# Patient Record
Sex: Female | Born: 2000 | Race: Black or African American | Hispanic: No | Marital: Single | Smoking: Never smoker
Health system: Southern US, Community
[De-identification: ages and names within clinical notes are randomized; demographics above are authoritative.]

## PROBLEM LIST (undated history)

## (undated) DIAGNOSIS — F329 Major depressive disorder, single episode, unspecified: Secondary | ICD-10-CM

## (undated) DIAGNOSIS — F32A Depression, unspecified: Secondary | ICD-10-CM

## (undated) DIAGNOSIS — R011 Cardiac murmur, unspecified: Secondary | ICD-10-CM

## (undated) DIAGNOSIS — D219 Benign neoplasm of connective and other soft tissue, unspecified: Secondary | ICD-10-CM

## (undated) HISTORY — DX: Benign neoplasm of connective and other soft tissue, unspecified: D21.9

## (undated) HISTORY — DX: Cardiac murmur, unspecified: R01.1

## (undated) HISTORY — PX: NO PAST SURGERIES: SHX2092

---
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
## 2020-01-06 ENCOUNTER — Telehealth: Payer: Self-pay | Admitting: Family Medicine

## 2020-01-06 NOTE — Telephone Encounter (Signed)
Attempted to contact patient to get her scheduled for her new ob intake and provider visit. Unable to reach patient due to the number stating that the call could not be completed at this time.

## 2020-03-08 ENCOUNTER — Other Ambulatory Visit: Payer: Self-pay

## 2020-03-08 ENCOUNTER — Encounter (HOSPITAL_COMMUNITY): Payer: Self-pay | Admitting: Obstetrics and Gynecology

## 2020-03-08 ENCOUNTER — Inpatient Hospital Stay (HOSPITAL_COMMUNITY)
Admission: AD | Admit: 2020-03-08 | Discharge: 2020-03-08 | Disposition: A | Discharge status: Home / Self Care | Payer: Medicaid Other | Attending: Obstetrics and Gynecology | Admitting: Obstetrics and Gynecology

## 2020-03-08 ENCOUNTER — Telehealth: Payer: Self-pay | Admitting: General Practice

## 2020-03-08 DIAGNOSIS — D259 Leiomyoma of uterus, unspecified: Secondary | ICD-10-CM | POA: Insufficient documentation

## 2020-03-08 DIAGNOSIS — O3412 Maternal care for benign tumor of corpus uteri, second trimester: Secondary | ICD-10-CM | POA: Insufficient documentation

## 2020-03-08 DIAGNOSIS — R102 Pelvic and perineal pain: Secondary | ICD-10-CM | POA: Diagnosis not present

## 2020-03-08 DIAGNOSIS — O26892 Other specified pregnancy related conditions, second trimester: Secondary | ICD-10-CM | POA: Diagnosis not present

## 2020-03-08 DIAGNOSIS — Z3A16 16 weeks gestation of pregnancy: Secondary | ICD-10-CM | POA: Insufficient documentation

## 2020-03-08 LAB — URINALYSIS, ROUTINE W REFLEX MICROSCOPIC
Bilirubin Urine: NEGATIVE
Glucose, UA: NEGATIVE mg/dL
Hgb urine dipstick: NEGATIVE
Ketones, ur: NEGATIVE mg/dL
Leukocytes,Ua: NEGATIVE
Nitrite: NEGATIVE
Protein, ur: NEGATIVE mg/dL
Specific Gravity, Urine: 1.025 (ref 1.005–1.030)
pH: 6 (ref 5.0–8.0)

## 2020-03-08 MED ORDER — CYCLOBENZAPRINE HCL 10 MG PO TABS
10.0000 mg | ORAL_TABLET | Freq: Three times a day (TID) | ORAL | 1 refills | Status: DC | PRN
Start: 2020-03-08 — End: 2020-07-14

## 2020-03-08 NOTE — MAU Provider Note (Signed)
First Provider Initiated Contact with Patient 03/08/20 1332      S [name] is a 19 y.o. G35P0 non-pregnant female who presents to MAU today with complaint of pelvic pain. She has been diagnosed with a fibroid during her [facility] in [place] and this is the same pain. She denies pain at this time, but is worried that the pain she's been having has affected the baby. Also wants to know the gender because she has a gender reveal on Sunday.   O BP 111/72 (BP Location: Right Arm)   Pulse 68   Temp 98.2 F (36.8 C) (Oral)   Resp 12   Wt 154 lb 1 oz (69.9 kg)   LMP 11/17/2019   SpO2 100%   BMI 23.77 kg/m  Physical Exam Vitals and nursing note reviewed.  Constitutional:      Appearance: She is well-developed and normal weight.  Cardiovascular:     Rate and Rhythm: Normal rate and regular rhythm.     Heart sounds: Normal heart sounds.  Pulmonary:     Effort: Pulmonary effort is normal.     Breath sounds: Normal breath sounds.  Abdominal:     General: Abdomen is flat. Bowel sounds are normal.     Palpations: Abdomen is soft.     Tenderness: There is no abdominal tenderness.     Comments: Appropriate gravid for dates  Genitourinary:    Comments: Cervical exam deferred, no VB/cramping  Skin:    General: Skin is warm and dry.     Capillary Refill: Capillary refill takes less than 2 seconds.  Neurological:     Mental Status: She is alert and oriented to person, place, and time.  Psychiatric:        Mood and Affect: Mood normal.        Behavior: Behavior normal.     A [redacted]w[redacted]d pregnant female Medical screening exam complete Hx of pelvic pain r/t fibroid - sent flexeril prescription to pharmacy, pt prefers to take at night (not now in MAU)  P Discharge from MAU in stable condition Patient to stay in [place] and establish prenatal care, desires to begin at [facility] for gender reveal prenatal imaging given Preterm labor precautions given Patient may  return to MAU as needed for pregnancy related complaints  Beverly Mckenzie, CNM 03/08/2020 1:52 PM

## 2020-03-08 NOTE — MAU Note (Signed)
Pt states that she got checked out in [place] about a month ago for abdominal pain. Pt reports that she was told she had a fibroid.   Pt states that she is reporting similar pain to that time, but more intense.   Denies vaginal bleeding or LOF.

## 2020-03-08 NOTE — Telephone Encounter (Signed)
Called and informed patient that she would need to come by the office to sign ROI to have records for current pregnancy faxed.  Pt verbalized understanding.

## 2020-03-08 NOTE — Telephone Encounter (Signed)
-----   Message from Gabriel Carina, [place] sent at 03/08/2020  2:04 PM EDT ----- Regarding: New OB Needs Appt Hello! Could you please contact this patient to schedule a NOB appt and anatomy scan? She was seen in MAU today and needs to establish care, prefers to do so at [place]. She was being seen for [facility] in [place], but is relocating permanently back to [place]. Thank you!!

## 2020-03-08 NOTE — Discharge Instructions (Signed)
[facility] [address] [place], [place] [address] [phone]  Second Trimester of Pregnancy  The second trimester is from week 14 through week 27 (month 4 through 6). This is often the time in pregnancy that you feel your best. Often times, morning sickness has lessened or quit. You may have more energy, and you may get hungry more often. Your unborn baby is growing rapidly. At the end of the sixth month, he or she is about 9 inches long and weighs about 1 pounds. You will likely feel the baby move between 18 and 20 weeks of pregnancy. Follow these instructions at home: Medicines  Take over-the-counter and prescription medicines only as told by your doctor. Some medicines are safe and some medicines are not safe during pregnancy.  Take a prenatal vitamin that contains at least 600 micrograms (mcg) of folic acid.  If you have trouble pooping (constipation), take medicine that will make your stool soft (stool softener) if your doctor approves. Eating and drinking   Eat regular, healthy meals.  Avoid raw meat and uncooked cheese.  If you get low calcium from the food you eat, talk to your doctor about taking a daily calcium supplement.  Avoid foods that are high in fat and sugars, such as fried and sweet foods.  If you feel sick to your stomach (nauseous) or throw up (vomit): ? Eat 4 or 5 small meals a day instead of 3 large meals. ? Try eating a few soda crackers. ? Drink liquids between meals instead of during meals.  To prevent constipation: ? Eat foods that are high in fiber, like fresh fruits and vegetables, whole grains, and beans. ? Drink enough fluids to keep your pee (urine) clear or pale yellow. Activity  Exercise only as told by your doctor. Stop exercising if you start to have cramps.  Do not exercise if it is too hot, too humid, or if you are in a place of great height (high altitude).  Avoid heavy lifting.  Wear low-heeled shoes. Sit and  stand up straight.  You can continue to have sex unless your doctor tells you not to. Relieving pain and discomfort  Wear a good support bra if your breasts are tender.  Take warm water baths (sitz baths) to soothe pain or discomfort caused by hemorrhoids. Use hemorrhoid cream if your doctor approves.  Rest with your legs raised if you have leg cramps or low back pain.  If you develop puffy, bulging veins (varicose veins) in your legs: ? Wear support hose or compression stockings as told by your doctor. ? Raise (elevate) your feet for 15 minutes, 3-4 times a day. ? Limit salt in your food. Prenatal care  Write down your questions. Take them to your prenatal visits.  Keep all your prenatal visits as told by your doctor. This is important. Safety  Wear your seat belt when driving.  Make a list of emergency phone numbers, including numbers for family, friends, the hospital, and police and fire departments. General instructions  Ask your doctor about the right foods to eat or for help finding a counselor, if you need these services.  Ask your doctor about local prenatal classes. Begin classes before month 6 of your pregnancy.  Do not use hot tubs, steam rooms, or saunas.  Do not douche or use tampons or scented sanitary pads.  Do not cross your legs for long periods of time.  Visit your dentist if you have not done so.  Use a soft toothbrush to brush your teeth. Floss gently.  Avoid all smoking, herbs, and alcohol. Avoid drugs that are not approved by your doctor.  Do not use any products that contain nicotine or tobacco, such as cigarettes and e-cigarettes. If you need help quitting, ask your doctor.  Avoid cat litter boxes and soil used by cats. These carry germs that can cause birth defects in the baby and can cause a loss of your baby (miscarriage) or stillbirth. Contact a doctor if:  You have mild cramps or pressure in your lower belly.  You have pain when you pee  (urinate).  You have bad smelling fluid coming from your vagina.  You continue to feel sick to your stomach (nauseous), throw up (vomit), or have watery poop (diarrhea).  You have a nagging pain in your belly area.  You feel dizzy. Get help right away if:  You have a fever.  You are leaking fluid from your vagina.  You have spotting or bleeding from your vagina.  You have severe belly cramping or pain.  You lose or gain weight rapidly.  You have trouble catching your breath and have chest pain.  You notice sudden or extreme puffiness (swelling) of your face, hands, ankles, feet, or legs.  You have not felt the baby move in over an hour.  You have severe headaches that do not go away when you take medicine.  You have trouble seeing. Summary  The second trimester is from week 14 through week 27 (months 4 through 6). This is often the time in pregnancy that you feel your best.  To take care of yourself and your unborn baby, you will need to eat healthy meals, take medicines only if your doctor tells you to do so, and do activities that are safe for you and your baby.  Call your doctor if you get sick or if you notice anything unusual about your pregnancy. Also, call your doctor if you need help with the right food to eat, or if you want to know what activities are safe for you. This information is not intended to replace advice given to you by your health care provider. Make sure you discuss any questions you have with your health care provider. Document Revised: 12/05/2018 Document Reviewed: 09/18/2016 Elsevier Patient Education  [place].

## 2020-03-21 ENCOUNTER — Telehealth: Payer: Self-pay | Admitting: Obstetrics & Gynecology

## 2020-03-21 NOTE — Telephone Encounter (Signed)
Called pt regarding her request. She stated that she has vaginal itching and irritation consistent with a known yeast infection she has had in the past. She has OTC Miconazole 1200 mg suppository and wants to know if it is safe to use this. Per consult w/Dr. Rip Harbour, pt was advised that she may use that medication per package directions. She voiced understanding.

## 2020-03-21 NOTE — Telephone Encounter (Signed)
Patient is requesting a RX for yeast inf.  [facility] [address]

## 2020-03-29 ENCOUNTER — Ambulatory Visit (INDEPENDENT_AMBULATORY_CARE_PROVIDER_SITE_OTHER): Payer: Medicaid Other | Admitting: Student

## 2020-03-29 ENCOUNTER — Encounter: Payer: Self-pay | Admitting: Student

## 2020-03-29 ENCOUNTER — Other Ambulatory Visit: Payer: Self-pay

## 2020-03-29 VITALS — BP 115/75 | HR 87 | Wt 157.6 lb

## 2020-03-29 DIAGNOSIS — J4520 Mild intermittent asthma, uncomplicated: Secondary | ICD-10-CM

## 2020-03-29 DIAGNOSIS — J45909 Unspecified asthma, uncomplicated: Secondary | ICD-10-CM | POA: Insufficient documentation

## 2020-03-29 DIAGNOSIS — Z3A19 19 weeks gestation of pregnancy: Secondary | ICD-10-CM | POA: Diagnosis not present

## 2020-03-29 DIAGNOSIS — D259 Leiomyoma of uterus, unspecified: Secondary | ICD-10-CM | POA: Insufficient documentation

## 2020-03-29 DIAGNOSIS — Z3480 Encounter for supervision of other normal pregnancy, unspecified trimester: Secondary | ICD-10-CM | POA: Insufficient documentation

## 2020-03-29 MED ORDER — BLOOD PRESSURE MONITORING DEVI: 1.0000 | 0 refills | Status: DC

## 2020-03-29 NOTE — Patient Instructions (Signed)
AREA PEDIATRIC/FAMILY PRACTICE PHYSICIANS  [place] ([address]) . [facility] o Chambliss, MD; Eniola, MD; Hale, MD; Hensel, MD; McDiarmid, MD; McIntyer, MD; Emeril Stille, MD; Walden, MD o [address] [place], [place] [address] o [phone] o Mon-Fri 8:30-12:30, 1:30-5:00 o Providers come to see babies at [facility] o Accepting Medicaid . [facility] o Limited providers who accept newborns: Koirala, MD; Morrow, MD; Wolters, MD o [address], [place], [place] [address] o [phone] o Mon-Fri 8:00-5:30 o Babies seen by providers at [facility] o Does NOT accept Medicaid o Please call early in hospitalization for appointment (limited availability)  . [facility] o Mulberry, MD o [address] [place], [place] [address] o [phone] o Mon, Tue, Thur, Fri 8:30-5:00, Wed 10:00-7:00 (closed 1-2pm) o Babies seen by [facility] providers o Accepting Medicaid . [facility] o Rubin, MD o [address], [place], [place] [address] o [phone] o Mon-Fri 8:30-5:00, Sat 8:30-12:00 o Provider comes to see babies at [facility] o Accepting Medicaid o Must have been referred from current patients or contacted office prior to delivery . [facility] ([facility]) o Brown, MD; Chandler, MD; Ettefagh, MD; Grant, MD; Lester, MD; McCormick, MD; McQueen, MD; Prose, MD; Simha, MD; Stanley, MD; Stryffeler, NP; Tebben, NP o [address], [place], [place] [address] o [phone] o Mon, Tue, Thur, Fri 8:30-5:30, Wed 9:30-5:30, Sat 8:30-12:30 o Babies seen by [facility] providers o Accepting Medicaid o Only accepting infants of first-time parents or siblings of current patients o Hospital discharge coordinator will make follow-up appointment . Jack Amos o [address],  [place], [place]  [address] o [phone]   Fax - [phone] . [facility] o [address], [place], [place]  [address] o Phone - [phone]   Fax - [phone] . Shilpa Gosrani o [address], [place], [place]  [address] o [phone]  [place] ([address]) . [facility] o Bates, MD; [place]assfield, MD; [place], Cox, MD; MD; Davis, MD; Dovico, MD; Ettefaugh, MD; Little, MD; Lowe, MD; Keiffer, MD; Melvin, MD; Sumner, MD; Williams, MD o [address], [place], [place] [address] o [phone] o Mon-Fri 8:30-5:00 (extended evenings Mon-Thur as needed), Sat-Sun 10:00-1:00 o Providers come to see babies at [facility] o Accepting Medicaid for families of first-time babies and families with all children in the household age 3 and under. Must register with office prior to making appointment (M-F only). . [facility] o Henson, NP; Knapp, MD; Lalonde, MD; Tysinger, PA o [address] [place], [place] [address] o [phone] o Mon-Fri 8:00-5:00 o Babies seen by providers at [facility] o Does NOT accept Medicaid/Commercial Insurance Only . [facility] ([facility]  o Artis, MD; Barnes, MD; Bratton, MD; Coccaro, MD; Lockett Gardner, MD; Kramer, MD; Marshall, MD; Netherton, MD; Poleto, MD; Skinner, MD o [address] [place], [place] [address] o [phone] o Mon-Fri 8:30-5:30, Sat (Oct.-Mar.) 9:00-1:00 o Babies seen by providers at [facility] o Accepting Medicaid  [place] ([address]) . [facility] o Reid, MD; Warner, MD o [address], , [place] [address] o [phone] o Mon-Fri 8:30-5:00, Sat 8:30-12:00 o Providers come to see babies at [facility] o Does NOT accept Medicaid . [facility] o Becker, PA; Hagler, MD; Scifres, PA; Sun, MD; Swayne, MD o [address],  [place], [place] [address] o [phone] o Mon-Fri 8:00-5:00 o Babies seen by providers at [facility] o Does NOT accept Medicaid o Only accepting babies of parents who are patients o Please call early in hospitalization for appointment (limited availability) . [facility] o Clark, MD; Frye, MD; Kelleher, MD; Mack, NP; Miller, MD; O'Keller, MD; Patterson, NP; Pudlo, MD; Puzio, MD; Thomas, MD; Tucker, MD; Twiselton, MD o [address], [place], [place] [address] o [phone] o Mon-Fri 8:00-5:00, Sat 9:00-12:00 o Providers come to see babies at [facility] o Does NOT accept Medicaid  [place] ([address]) . [facility] o Limited providers accepting new patients: Brake, NP; Wharton, PA o [address], [place], [place] [address] o [phone] o Mon-Fri 8:00-5:00 o Babies seen by providers at [facility] o Does NOT accept Medicaid o Only accepting babies of parents who are patients o Please call early in hospitalization for appointment (limited availability) . [facility] o Gay, MD; Quinlan, MD o [address] [place], [place] [address] o [phone] (press 1 to schedule appointment) o Mon-Fri 8:00-5:00 o Providers come to see babies at [facility] o Does NOT accept Medicaid . [facility] o Mazer, MD o [address] [place], [place] [address] o [phone] o Mon-Fri 8:30-5:00 (lunch 12:30-1:00), extended hours by appointment only Wed 5:00-6:30 o Babies seen by [facility] providers o Accepting Medicaid . [facility] o Banks, MD; Jordan, MD; Koberlein, MD o [address], [place], [place] [address] o [phone] o Mon-Fri 8:00-5:00 o Babies seen by [facility] providers o Does NOT accept Medicaid . [facility] o Parker, MD; Hunter, MD; Wallace, DO o [address] [place], [place]  [address] o [phone] o Mon-Fri 8:00-5:00 o Babies seen by [facility] providers o Does NOT accept Medicaid . [facility] o Brandon, PA; Brecken, PA; Christy, NP; Dees, MD; DeClaire, MD; DeWeese, MD; Hansen, NP; Mills, NP; Parrish, NP; Smoot, NP; Summer, MD; Vapne, MD o [address] [place], [place] [address] o [phone] o Mon-Fri 8:30-5:00, Sat 10:00-1:00 o Providers come to see babies at [facility] o Does NOT accept Medicaid o Free prenatal information session Tuesdays at 4:45pm . [facility] o Bouska, MD; Gordon, PA; Jeffery, PA; Weber, PA o [address] [place] [address] o [phone] o Mon-Fri 7:30-5:30 o Babies seen by [facility] providers . [facility] o [address], [place], [place]  [address] o [phone]   Fax - [phone]  [place] ([address] & [address]) . [facility] o Reese, MD o [address] [place], [place] [address] o [phone] o Mon-Thur 8:00-6:00 o Providers come to see babies at [facility] o Accepting Medicaid . [facility] o Anderson, NP; Badger, MD; Beal, PA; Spencer, PA o [address] , [place] [address] o [phone] o Mon-Thur 7:30-7:30, Fri 7:30-4:30 o Babies seen by [facility] providers o Accepting Medicaid . [facility] o Agbuya, MD; Klett, NP; Romgoolam, MD o [address], , [place] [address] o [phone] o Mon-Fri 8:30-5:00, Sat 8:30-12:00 o Providers come to see babies at [facility] o Accepting Medicaid o Must have "Meet & Greet" appointment at office prior to delivery . [facility] o McCord,   MD; Wallace, MD; Wood, MD o [address], [place], [place] [address] o [phone] o Mon-Wed 8:00-6:00, Thur-Fri 8:00-5:00, Sat 9:00-12:00 o Providers come to  see babies at [facility] o Does NOT accept Medicaid o Only accepting siblings of current patients . [facility]  o [address], [place], [place]  [address] o [phone]   Fax - [phone] . [facility] o [address], [place], [place]  [address] o [phone]   Fax - [phone]  [place] ([address]) . [facility] o Cirigliano, DO; Matthews, DO o [address] [place], [place] [address] o [phone] o Mon-Fri 7:00-5:00 o Babies seen by [facility] providers o Does NOT accept Medicaid . [facility] o Briscoe, MD; Howley, PA; Moreira, PA o [address], [place], [place] [address] o [phone] o Mon-Fri 8:00-5:00 o Babies seen by [facility] providers o Accepting Medicaid . [facility] o Boyd, MD; Church, PA; Jones, NP; Osborn, PA o [address], [place], [place] [address] o [phone] o Mon-Fri 8:00-5:00 o Babies seen by providers at [facility] o Accepting Medicaid  [place] ([address]) . [facility] [place] o Wendling, DO o [address] [place], [place] [address] o [phone] o Mon-Fri 8:00-5:00 o Babies seen by [facility] providers o Does NOT accept Medicaid o Limited availability, please call early in hospitalization to schedule follow-up . [facility] o Calderon, PA; Cummings, MD; Dillard, MD; Martin, PA; Olson, MD; VanDeven, PA o [address], [place], [place] [address] o [phone] o Mon-Fri 8:30-5:00, Sat 9:00-12:00 o Babies seen by providers at [facility] o Accepting Medicaid o Please register online then schedule online or call office o [email or web address] . [facility] o Hunter, NP; Kumar, MD; Martin Rogers, PA o [address], [place], [place] [address] o [phone] o Mon-Fri 8:00-5:00 o Babies seen by providers at [facility] o Accepting Medicaid . [facility] ([facility] o [place], MD; Kristi Fleenor, NP; West, MD o [address], [place], [place] [address] o [phone] o Mon-Fri 8:00-5:30, Sat&Sun by appointment (phones open at 8:30) o Babies seen by [facility] providers o Accepting Medicaid o Must be a first-time baby or sibling of current patient . [facility] [place]  o [address], [place], [place]  [address] o [phone]   Fax - [phone]  [place] ([address]) . [facility] o Brown, PA; Cowen, PA; Rice, MD; Helton, PA; Spry, MD o [address] [place], [place] [address] o [phone] o Mon-Thur 8:00-7:00, Fri 8:00-5:00, Sat 8:00-12:00, Sun 9:00-12:00 o Babies seen by [facility] providers o Accepting Medicaid . [facility] o Coe-Goins, MD; Marshall, MD; Pierre-Louis, MD o [address] [place], [place], [place] [address] o [phone] o Mon-Thur 8:00-5:00 o Babies seen by providers at [facility] o Accepting Medicaid . [facility] o Bratton, MD; Coe-Goins, MD; Hayes, MD; Lewis, MD; List, MD; Lott, MD; Marshall, MD; Moran, MD; O'Eyvette Cordon, MD; Pierre-Louis, MD; Pitonzo, MD; Scholer, MD; Spangle, MD o [address] [place],    [address] o [phone] o Mon-Fri 8:00-5:30, Sat (Oct.-Mar.) 9:00-1:00 o Babies seen by providers at [facility] o Accepting Medicaid o Must fill out new patient packet, available online at [email or web address] . [facility] ([facility] o Friddle, NP; Harris, NP; Kelly, NP; Logan, MD;  Melvin, PA; Poth, MD; Ramadoss, MD; Stanton, NP o [address], [place], [place] [address] o [phone] o Mon-Thur 8:00-5:30, Fri 8:00-5:00 o Babies seen by providers at [facility] o Accepting Medicaid  [place] ([address]) . [facility] o Dixon, PA; Faribault, MD; Pickard, MD; Tapia, PA o [address], [place], [place] [address] o [phone] o Mon-Fri 8:00-5:00 o Babies seen by providers at [facility] o Accepting Medicaid   [place] ([address]) . [facility] [place] o Masneri, DO; Meyers, MD; Nelson, PA o [address], [place], [place] [address] o [phone] o Mon-Fri 8:00-5:00 o Babies seen by providers at [facility] o Does NOT accept Medicaid o Limited appointment availability, please call early in hospitalization  . [facility] [place] o Kunedd, DO; McGowen, MD o [address], [place], [place] [address] o [phone] o Mon-Fri 8:00-5:00 o Babies seen by [facility] providers o Does NOT accept Medicaid . [facility] [place] o Cameron, MD; MacDonald, MD; Michaels, PA; Nayak, MD o [address] [place] [address], [place], [place] [address] o [phone] o Mon-Fri 8:00-5:00 o After hours clinic ([address] [place], [place] [address]) [phone] Mon-Fri 5:00-8:00, Sat 12:00-6:00, Sun 10:00-4:00 o Babies seen by [facility] providers o Accepting Medicaid . [facility] [place] o [address], [place], [place]  [address] o [phone]   Fax - [phone]  [place] ([address]) . [facility] [place] [facility] o Andy, MD o [address], [place], [place] [address] o [phone] o Mon-Fri 8:00-5:00 o Babies seen by [facility] providers o Does NOT accept Medicaid . [facility] [place] ([facility] [place]) o Eksir, MD o [address], [place], [place]  [address] o [phone] o Mon-Thur 8:00-7:00, Fri 8:00-5:00, Sat 8:00-12:00 o Babies seen by providers at [facility] o Accepting Medicaid - but does not have vaccinations in office (must be received elsewhere) o Limited availability, please call early in hospitalization  [place] ([address]) . [facility]  o Charlene Flemming, MD o [address], [place]  [address] o [phone]  Fax [phone]   

## 2020-03-29 NOTE — Progress Notes (Signed)
Medicaid Home Form Completed-03/29/2020

## 2020-03-29 NOTE — Progress Notes (Signed)
  Subjective:    Beverly Mckenzie is being seen today for her first obstetrical visit.  This is not a planned pregnancy. She is at [redacted]w[redacted]d gestation. Her obstetrical history is significant for asthma. She reports that she had childhood asthma. Last inhaler use was 1 year ago, and last asthma attack was 4 years ago. . Relationship with FOB: significant other, not living together. Patient does intend to breast feed. Pregnancy history fully reviewed. Partner is enlisted in [employer]; he goes to Secondary school teacher in august and then will be back in November.  Patient reports no complaints.  Review of Systems:   Review of Systems  Constitutional: Negative.   HENT: Negative.   Respiratory: Negative.   Cardiovascular: Negative.   Gastrointestinal: Negative.   Neurological: Negative.   Psychiatric/Behavioral: Negative.     Objective:     BP 115/75   Pulse 87   Wt 157 lb 9.6 oz (71.5 kg)   LMP 11/17/2019   BMI 24.32 kg/m  Physical Exam Constitutional:      Appearance: Normal appearance.  HENT:     Head: Normocephalic.  Cardiovascular:     Rate and Rhythm: Normal rate and regular rhythm.  Musculoskeletal:        General: Normal range of motion.  Skin:    General: Skin is warm and dry.  Neurological:     Mental Status: She is alert.     Exam    Assessment:    Pregnancy: G1P0 Patient Active Problem List   Diagnosis Date Noted  . Supervision of other normal pregnancy, antepartum 03/29/2020  . 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 Asthma        Plan:     Initial labs drawn. Prenatal vitamins. Problem list reviewed and updated. AFP3 discussed: requested. Role of ultrasound in pregnancy discussed; fetal survey: ordered. Amniocentesis discussed: not indicated. Follow up in 4 weeks. 75% of 30 min visit spent on counseling and coordination of care.  -Genetic testing today -She has MyChart on her phone -Enrolled in [place] -Discussed blood pressure monitoring -[place] ordered, first  trimester labs ordered -Welcomed patient to practice and explained role of students, residents, NPs, PA, CNMs and MD/DO.  Mervyn Skeeters [facility] 03/29/2020

## 2020-03-30 LAB — CBC/D/PLT+RPR+RH+ABO+RUB AB...
Antibody Screen: NEGATIVE
Basophils Absolute: 0 10*3/uL (ref 0.0–0.2)
Basos: 0 %
EOS (ABSOLUTE): 0.3 10*3/uL (ref 0.0–0.4)
Eos: 3 %
HCV Ab: <0.1 s/co ratio (ref 0.0–0.9)
HIV Screen 4th Generation wRfx: NONREACTIVE
Hematocrit: 31.5 % — ABNORMAL LOW (ref 34.0–46.6)
Hemoglobin: 11.1 g/dL (ref 11.1–15.9)
Hepatitis B Surface Ag: NEGATIVE
Immature Grans (Abs): 0 10*3/uL (ref 0.0–0.1)
Immature Granulocytes: 0 %
Lymphocytes Absolute: 1.3 10*3/uL (ref 0.7–3.1)
Lymphs: 14 %
MCH: 30.8 pg (ref 26.6–33.0)
MCHC: 35.2 g/dL (ref 31.5–35.7)
MCV: 88 fL (ref 79–97)
Monocytes Absolute: 0.6 10*3/uL (ref 0.1–0.9)
Monocytes: 6 %
Neutrophils Absolute: 7.1 10*3/uL — ABNORMAL HIGH (ref 1.4–7.0)
Neutrophils: 77 %
Platelets: 296 10*3/uL (ref 150–450)
RBC: 3.6 x10E6/uL — ABNORMAL LOW (ref 3.77–5.28)
RDW: 12.6 % (ref 11.7–15.4)
RPR Ser Ql: NONREACTIVE
Rh Factor: POSITIVE
Rubella Antibodies, IGG: 1.72 index (ref >0.99)
WBC: 9.3 10*3/uL (ref 3.4–10.8)

## 2020-03-30 LAB — HEMOGLOBIN A1C
Est. average glucose Bld gHb Est-mCnc: 100 mg/dL
Hgb A1c MFr Bld: 5.1 % (ref 4.8–5.6)

## 2020-03-30 LAB — POCT URINALYSIS DIP (DEVICE)
Bilirubin Urine: NEGATIVE
Glucose, UA: NEGATIVE mg/dL
Ketones, ur: NEGATIVE mg/dL
Nitrite: NEGATIVE
Protein, ur: NEGATIVE mg/dL
Specific Gravity, Urine: 1.025 (ref 1.005–1.030)
Urobilinogen, UA: 0.2 mg/dL (ref 0.0–1.0)
pH: 6.5 (ref 5.0–8.0)

## 2020-03-30 LAB — HCV INTERPRETATION

## 2020-03-31 ENCOUNTER — Ambulatory Visit: Payer: Medicaid Other

## 2020-03-31 LAB — AFP, SERUM, OPEN SPINA BIFIDA
AFP MoM: 1.43
AFP Value: 77.7 ng/mL
Gest. Age on Collection Date: 19 weeks
Maternal Age At EDD: 19 yr
OSBR Risk 1 IN: 6621
Test Results:: NEGATIVE
Weight: 158 [lb_av]

## 2020-04-01 LAB — URINE CULTURE, OB REFLEX

## 2020-04-01 LAB — CULTURE, OB URINE

## 2020-04-05 ENCOUNTER — Other Ambulatory Visit: Payer: Self-pay | Admitting: *Deleted

## 2020-04-05 ENCOUNTER — Ambulatory Visit: Payer: Medicaid Other | Attending: Student

## 2020-04-05 ENCOUNTER — Other Ambulatory Visit: Payer: Self-pay

## 2020-04-05 DIAGNOSIS — Z3A20 20 weeks gestation of pregnancy: Secondary | ICD-10-CM

## 2020-04-05 DIAGNOSIS — Z3480 Encounter for supervision of other normal pregnancy, unspecified trimester: Secondary | ICD-10-CM

## 2020-04-05 DIAGNOSIS — Z363 Encounter for antenatal screening for malformations: Secondary | ICD-10-CM

## 2020-04-05 DIAGNOSIS — O3412 Maternal care for benign tumor of corpus uteri, second trimester: Secondary | ICD-10-CM | POA: Diagnosis not present

## 2020-04-05 DIAGNOSIS — Z362 Encounter for other antenatal screening follow-up: Secondary | ICD-10-CM

## 2020-04-14 ENCOUNTER — Telehealth: Payer: Self-pay | Admitting: Lactation Services

## 2020-04-14 ENCOUNTER — Encounter: Payer: Self-pay | Admitting: *Deleted

## 2020-04-14 NOTE — Telephone Encounter (Signed)
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 Call could not be completed at this time and no answer from patient. My Chart Message sent.

## 2020-04-16 ENCOUNTER — Encounter: Payer: Self-pay | Admitting: Student

## 2020-04-16 DIAGNOSIS — Z148 Genetic carrier of other disease: Secondary | ICD-10-CM | POA: Insufficient documentation

## 2020-04-26 ENCOUNTER — Telehealth (INDEPENDENT_AMBULATORY_CARE_PROVIDER_SITE_OTHER): Payer: Medicaid Other | Admitting: Student

## 2020-04-26 ENCOUNTER — Other Ambulatory Visit: Payer: Self-pay

## 2020-04-26 DIAGNOSIS — Z3A23 23 weeks gestation of pregnancy: Secondary | ICD-10-CM

## 2020-04-26 DIAGNOSIS — Z148 Genetic carrier of other disease: Secondary | ICD-10-CM

## 2020-04-26 DIAGNOSIS — Z3480 Encounter for supervision of other normal pregnancy, unspecified trimester: Secondary | ICD-10-CM

## 2020-04-26 NOTE — Progress Notes (Signed)
I connected with  Beverly Mckenzie on 04/26/20 at  3:35 PM EDT by telephone and verified that I am speaking with the correct person using two identifiers.   I discussed the limitations, risks, security and privacy concerns of performing an evaluation and management service by telephone and the availability of in person appointments. I also discussed with the patient that there may be a patient responsible charge related to this service. The patient expressed understanding and agreed to proceed.  Renaldo Harrison, RN 04/26/2020  4:03 PM

## 2020-04-26 NOTE — Patient Instructions (Signed)
DOULA LIST   [facility] Doula  [place]  [phone]  [email or web address]  [email or web address]  [place] the [place] [phone]  [email or web address]   Precious Walsh   [email or web address]   ??THE MOTHERLY DOULA?? Lajuana Ripple   [phone]   [email or web address]     The [facility]und[facility] Life Doula  Mattie Marlin  [phone]    [email or web address]   [facility]  Angie Rosier     [phone]     [email or web address]   Lenoria Farrier: [place] [phone]       [email or web address]  [email or web address]   [place] [phone]   [email or web address] [place]  Llana Aliment  [phone]  [email or web address]   [email or web address]   [facility]   Burney Gauze   [phone]  [email or web address]  [phone] [email or web address]  Natural Baby Doulas  Jessica Bower           [place]     [phone] [email or web address]  [email or web address]   [facility]   [place] Foxx [phone] [email or web address]   [facility] Doula Services  Abbie Sons     [phone]  [email or web address]  [facility]     [phone]  [email or web address]  Facebook and IG [email or web address] .Vivia Birmingham  [phone] [email or web address] .Dola Argyle [phone] [email or web address]   Delanna Ahmadi  [phone] [email or web address]     [facility]  [phone] [email or web address]   IG [email or web address]    Henrine Screws    [phone]  [email or web address]   Rachel Moulds "[place]" Free  [phone] [email or web address]    Mtende Roll  [phone] [email or web address]   Susie Williams   [email or web address]    Crissie Reese    [phone] [email or web address]     Carlean Jews  [phone] [email or web address]    Rigoberto Noel  [phone] [email or web address]    [place] Rhem    [phone]   [facility] Karie Mainland  [phone] [email or web address]  Doula Mama Hollie Beach [phone] [email or web address]             Childbirth Education Options: [facility] Department Classes:  Childbirth education classes can help you get ready for a positive parenting experience. You can also meet other expectant parents and get free stuff for your baby. Each class runs for five weeks on the same night and costs $45 for the mother-to-be and her support person. Medicaid covers the cost if you are eligible. Call [phone] to register. [facility] Childbirth Education:  [phone] or [phone] or [email or web address]  Baby & Me Class: Discuss newborn & infant parenting and family adjustment issues with other new mothers in a relaxed environment. Each week brings a new speaker or baby-centered activity. We encourage new mothers to join [place] every Thursday at 11:00am. Babies birth until crawling. No registration or fee. Daddy [employer]: This course offers Dads-to-be the tools and knowledge needed to feel confident on their journey to becoming new fathers. Experienced dads, who have been trained as coaches, teach dads-to-be how to hold, comfort, diaper, swaddle and play with their infant while being able to support the new mom as well. A class for men taught by men. $25/dad Big Brother/Big Sister: Let your children share in the joy of a new brother or sister in this  special class designed just for them. Class includes discussion  about how families care for babies: swaddling, holding, diapering, safety as well as how they can be helpful in their new role. This class is designed for children ages 55 to 75, but any age is welcome. Please register each child individually. $5/child  Mom Talk: This mom-led group offers support and connection to mothers as they journey through the adjustments and struggles of that sometimes overwhelming first year after the birth of a child. Tuesdays at 10:00am and Thursdays at 6:00pm. Babies welcome. No registration or fee. Breastfeeding Support Group: This group is a mother-to-mother support circle where moms have the opportunity to share their breastfeeding experiences. A Lactation Consultant is present for questions and concerns. Meets each Tuesday at 11:00am. No fee or registration. Breastfeeding Your Baby: Learn what to expect in the first days of breastfeeding your newborn.  This class will help you feel more confident with the skills needed to begin your breastfeeding experience. Many new mothers are concerned about breastfeeding after leaving the hospital. This class will also address the most common fears and challenges about breastfeeding during the first few weeks, months and beyond. (call for fee) Comfort Techniques and Tour: This 2 hour interactive class will provide you the opportunity to learn & practice hands-on techniques that can help relieve some of the discomfort of labor and encourage your baby to rotate toward the best position for birth. You and your partner will be able to try a variety of labor positions with birth balls and rebozos as well as practice breathing, relaxation, and visualization techniques. A tour of the [facility] is included with this class. $20 per registrant and support person Childbirth Class- Weekend Option: This class is a Weekend version of our Birth & Baby series. It is designed for parents who have a difficult time fitting several  weeks of classes into their schedule. It covers the care of your newborn and the basics of labor and childbirth. It also includes a [place] [facility] and lunch. The class is held two consecutive days: beginning on Friday evening from 6:30 - 8:30 p.m. and the next day, Saturday from 9 a.m. - 4 p.m. (call for fee) Doren Custard Class: Interested in a waterbirth?  This informational class will help you discover whether waterbirth is the right fit for you. Education about waterbirth itself, supplies you would need and how to assemble your support team is what you can expect from this class. Some obstetrical practices require this class in order to pursue a waterbirth. (Not all obstetrical practices offer waterbirth-check with your healthcare provider.) Register only the expectant mom, but you are encouraged to bring your partner to class! Required if planning waterbirth, no fee. Infant/Child CPR: Parents, grandparents, babysitters, and friends learn Cardio-Pulmonary Resuscitation skills for infants and children. You will also learn how to treat both conscious and unconscious choking in infants and children. This Family & Friends program does not offer certification. Register each participant individually to ensure that enough mannequins are available. (Call for fee) Grandparent Love: Expecting a grandbaby? This class is for you! Learn about the latest infant care and safety recommendations and ways to support your own child as he or she transitions into the parenting role. Taught by Registered Nurses who are childbirth instructors, but most importantly...they are grandmothers too! $10/person. Childbirth Class- Natural Childbirth: This series of 5 weekly classes is for expectant parents who want to learn and practice natural methods  of coping with the process of labor and childbirth. Relaxation, breathing, massage, visualization, role of the partner, and helpful positioning are highlighted.  Participants learn how to be confident in their body's ability to give birth. This class will empower and help parents make informed decisions about their own care. Includes discussion that will help new parents transition into the immediate postpartum period. [facility] is included. We suggest taking this class between 25-32 weeks, but it's only a recommendation. $75 per registrant and one support person or $30 Medicaid. Childbirth Class- 3 week Series: This option of 3 weekly classes helps you and your labor partner prepare for childbirth. Newborn care, labor & birth, cesarean birth, pain management, and comfort techniques are discussed and a [facility] is included. The class meets at the same time, on the same day of the week for 3 consecutive weeks beginning with the starting date you choose. $60 for registrant and one support person.  Marvelous Multiples: Expecting twins, triplets, or more? This class covers the differences in labor, birth, parenting, and breastfeeding issues that face multiples' parents. NICU tour is included. Led by a Certified Childbirth Educator who is the mother of twins. No fee. Caring for Baby: This class is for expectant and adoptive parents who want to learn and practice the most up-to-date newborn care for their babies. Focus is on birth through the first six weeks of life. Topics include feeding, bathing, diapering, crying, umbilical cord care, circumcision care and safe sleep. Parents learn to recognize symptoms of illness and when to call the pediatrician. Register only the mom-to-be and your partner or support person can plan to come with you! $10 per registrant and support person Childbirth Class- online option: This online class offers you the freedom to complete a Birth and Baby series in the comfort of your own home. The flexibility of this option allows you to review sections at your own pace, at  times convenient to you and your support people. It includes additional video information, animations, quizzes, and extended activities. Get organized with helpful eClass tools, checklists, and trackers. Once you register online for the class, you will receive an email within a few days to accept the invitation and begin the class when the time is right for you. The content will be available to you for 60 days. $60 for 60 days of online access for you and your support people.

## 2020-04-27 NOTE — Progress Notes (Signed)
I connected with Beverly Mckenzie 04/27/20 at  3:35 PM EDT by: MyChart video and verified that I am speaking with the correct person using two identifiers.  Patient is located at home and provider is located at [facility].     The purpose of this virtual visit is to provide medical care while limiting exposure to the novel coronavirus. I discussed the limitations, risks, security and privacy concerns of performing an evaluation and management service by MyChart video and the availability of in person appointments. I also discussed with the patient that there may be a patient responsible charge related to this service. By engaging in this virtual visit, you consent to the provision of healthcare.  Additionally, you authorize for your insurance to be billed for the services provided during this visit.  The patient expressed understanding and agreed to proceed.  The following staff members participated in the virtual visit:  Briant Cedar RN    PRENATAL VISIT NOTE  Subjective:  Beverly Mckenzie is a 19 y.o. G1P0 at [redacted]w[redacted]d  for phone visit for ongoing prenatal care.  She is currently monitored for the following issues for this low-risk pregnancy and has Supervision of other normal pregnancy, antepartum; Fibroid, uterine; Asthma; and Genetic carrier on their problem list.  Patient reports no complaints.  Contractions: Not present. Vag. Bleeding: None.  Movement: Present. Denies leaking of fluid.   The following portions of the patient's history were reviewed and updated as appropriate: allergies, current medications, past family history, past medical history, past social history, past surgical history and problem list.   Objective:  There were no vitals filed for this visit. Self-Obtained  Fetal Status:     Movement: Present     Assessment and Plan:  Pregnancy: G1P0 at 109w1d 1. Supervision of other normal pregnancy, antepartum -doing well. Reviewed upcoming appointments.  -discussed options for  childbirth education. Given list of approved doulas as well as local CBE.  2. 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 Has not been in touch with FOB as he is "gone for the [employer]"  3. [redacted] weeks gestation of pregnancy   Preterm labor symptoms and general obstetric precautions including but not limited to vaginal bleeding, contractions, leaking of fluid and fetal movement were reviewed in detail with the patient.  Return in about 4 weeks (around 05/24/2020) for return in 4-5 weeks in person for fasting labs.  Future Appointments  Date Time Provider [place]  05/12/2020 12:15 PM Kristen Loader, DO PP-PIEDPED [facility]  05/17/2020  2:30 PM [facility] US3 [facility]  05/23/2020  8:15 AM Starr Lake, CNM [facility]  05/23/2020  9:30 AM [facility] LAB [facility] [place]     Time spent on virtual visit: 7 minutes  Jorje Guild, NP

## 2020-05-12 ENCOUNTER — Institutional Professional Consult (permissible substitution): Payer: Self-pay | Admitting: Pediatrics

## 2020-05-13 ENCOUNTER — Other Ambulatory Visit: Payer: Self-pay

## 2020-05-13 ENCOUNTER — Ambulatory Visit (INDEPENDENT_AMBULATORY_CARE_PROVIDER_SITE_OTHER): Payer: Self-pay | Admitting: Pediatrics

## 2020-05-13 DIAGNOSIS — Z7681 Expectant parent(s) prebirth pediatrician visit: Secondary | ICD-10-CM

## 2020-05-16 ENCOUNTER — Encounter: Payer: Self-pay | Admitting: Student

## 2020-05-17 ENCOUNTER — Ambulatory Visit: Payer: Medicaid Other | Attending: Obstetrics and Gynecology

## 2020-05-17 ENCOUNTER — Encounter: Payer: Self-pay | Admitting: *Deleted

## 2020-05-17 ENCOUNTER — Ambulatory Visit: Payer: Medicaid Other | Admitting: *Deleted

## 2020-05-17 ENCOUNTER — Other Ambulatory Visit: Payer: Self-pay

## 2020-05-17 DIAGNOSIS — Z362 Encounter for other antenatal screening follow-up: Secondary | ICD-10-CM | POA: Insufficient documentation

## 2020-05-17 DIAGNOSIS — Z148 Genetic carrier of other disease: Secondary | ICD-10-CM

## 2020-05-17 DIAGNOSIS — Z3480 Encounter for supervision of other normal pregnancy, unspecified trimester: Secondary | ICD-10-CM | POA: Diagnosis present

## 2020-05-17 DIAGNOSIS — Z3A26 26 weeks gestation of pregnancy: Secondary | ICD-10-CM | POA: Diagnosis not present

## 2020-05-17 DIAGNOSIS — O3410 Maternal care for benign tumor of corpus uteri, unspecified trimester: Secondary | ICD-10-CM | POA: Diagnosis not present

## 2020-05-22 NOTE — Progress Notes (Signed)
Prenatal counseling for impending newborn done--  Reviewed current vaccine policy and answered all questions.  1st child, Currently 26 weeks, Current complications:  none, Prenatal care initiated:  early Z76.81

## 2020-05-23 ENCOUNTER — Other Ambulatory Visit: Payer: Medicaid Other

## 2020-05-23 ENCOUNTER — Ambulatory Visit (INDEPENDENT_AMBULATORY_CARE_PROVIDER_SITE_OTHER): Payer: Medicaid Other | Admitting: Student

## 2020-05-23 ENCOUNTER — Other Ambulatory Visit: Payer: Self-pay

## 2020-05-23 VITALS — BP 121/79 | HR 96 | Wt 163.8 lb

## 2020-05-23 DIAGNOSIS — Z23 Encounter for immunization: Secondary | ICD-10-CM | POA: Diagnosis not present

## 2020-05-23 DIAGNOSIS — Z3A26 26 weeks gestation of pregnancy: Secondary | ICD-10-CM

## 2020-05-23 DIAGNOSIS — Z3480 Encounter for supervision of other normal pregnancy, unspecified trimester: Secondary | ICD-10-CM

## 2020-05-23 NOTE — Progress Notes (Signed)
   PRENATAL VISIT NOTE  Subjective:  Beverly Mckenzie is a 19 y.o. G1P0 at [redacted]w[redacted]d being seen today for ongoing prenatal care.  She is currently monitored for the following issues for this low-risk pregnancy and has Supervision of other normal pregnancy, antepartum; Fibroid, uterine; Asthma; and Genetic carrier on their problem list.  Patient reports no complaints.  Contractions: Not present. Vag. Bleeding: None.  Movement: Present. Denies leaking of fluid.   The following portions of the patient's history were reviewed and updated as appropriate: allergies, current medications, past family history, past medical history, past social history, past surgical history and problem list.   Objective:   Vitals:   05/23/20 0834  BP: 121/79  Pulse: 96  Weight: 163 lb 12.8 oz (74.3 kg)    Fetal Status: Fetal Heart Rate (bpm): 138 Fundal Height: 26 cm Movement: Present     General:  Alert, oriented and cooperative. Patient is in no acute distress.  Skin: Skin is warm and dry. No rash noted.   Cardiovascular: Normal heart rate noted  Respiratory: Normal respiratory effort, no problems with respiration noted  Abdomen: Soft, gravid, appropriate for gestational age.  Pain/Pressure: Present     Pelvic: Cervical exam deferred        Extremities: Normal range of motion.  Edema: None  Mental Status: Normal mood and affect. Normal behavior. Normal judgment and thought content.   Assessment and Plan:  Pregnancy: G1P0 at [redacted]w[redacted]d 1. Supervision of other normal pregnancy, antepartum -2 hour GTT in process  -Patient has not been able to get in touch with [facility]. CNM called [facility] on patient's behalf and arranged for patient to have genetic counseling appointment tomorrow at 42.  -Fully vaccinated for Covid-19!  -Patient has BP cuff at home and has been checking BP; warning values reviewed and when to come to [facility].   Preterm labor symptoms and general obstetric precautions including but not limited to vaginal  bleeding, contractions, leaking of fluid and fetal movement were reviewed in detail with the patient. Please refer to After Visit Summary for other counseling recommendations.   Return in about 2 weeks (around 06/06/2020), or LROB.  Future Appointments  Date Time Provider [place]  05/23/2020  9:30 AM [facility]    Beverly Mckenzie, [place]

## 2020-05-23 NOTE — Patient Instructions (Signed)
Glucose Tolerance Test During Pregnancy Why am I having this test? The glucose tolerance test (GTT) is done to check how your body processes sugar (glucose). This is one of several tests used to diagnose diabetes that develops during pregnancy (gestational diabetes mellitus). Gestational diabetes is a temporary form of diabetes that some women develop during pregnancy. It usually occurs during the second trimester of pregnancy and goes away after delivery. Testing (screening) for gestational diabetes usually occurs between 24 and 28 weeks of pregnancy. You may have the GTT test after having a 1-hour glucose screening test if the results from that test indicate that you may have gestational diabetes. You may also have this test if:  You have a history of gestational diabetes.  You have a history of giving birth to very large babies or have experienced repeated fetal loss (stillbirth).  You have signs and symptoms of diabetes, such as: ? Changes in your vision. ? Tingling or numbness in your hands or feet. ? Changes in hunger, thirst, and urination that are not otherwise explained by your pregnancy. What is being tested? This test measures the amount of glucose in your blood at different times during a period of 3 hours. This indicates how well your body is able to process glucose. What kind of sample is taken?  Blood samples are required for this test. They are usually collected by inserting a needle into a blood vessel. How do I prepare for this test?  For 3 days before your test, eat normally. Have plenty of carbohydrate-rich foods.  Follow instructions from your health care provider about: ? Eating or drinking restrictions on the day of the test. You may be asked to not eat or drink anything other than water (fast) starting 8-10 hours before the test. ? Changing or stopping your regular medicines. Some medicines may interfere with this test. Tell a health care provider about:  All  medicines you are taking, including vitamins, herbs, eye drops, creams, and over-the-counter medicines.  Any blood disorders you have.  Any surgeries you have had.  Any medical conditions you have. What happens during the test? First, your blood glucose will be measured. This is referred to as your fasting blood glucose, since you fasted before the test. Then, you will drink a glucose solution that contains a certain amount of glucose. Your blood glucose will be measured again 1, 2, and 3 hours after drinking the solution. This test takes about 3 hours to complete. You will need to stay at the testing location during this time. During the testing period:  Do not eat or drink anything other than the glucose solution.  Do not exercise.  Do not use any products that contain nicotine or tobacco, such as cigarettes and e-cigarettes. If you need help stopping, ask your health care provider. The testing procedure may vary among health care providers and hospitals. How are the results reported? Your results will be reported as milligrams of glucose per deciliter of blood (mg/dL) or millimoles per liter (mmol/L). Your health care provider will compare your results to normal ranges that were established after testing a large group of people (reference ranges). Reference ranges may vary among labs and hospitals. For this test, common reference ranges are:  Fasting: less than 95-105 mg/dL (5.3-5.8 mmol/L).  1 hour after drinking glucose: less than 180-190 mg/dL (10.0-10.5 mmol/L).  2 hours after drinking glucose: less than 155-165 mg/dL (8.6-9.2 mmol/L).  3 hours after drinking glucose: 140-145 mg/dL (7.8-8.1 mmol/L). What do the   results mean? Results within reference ranges are considered normal, meaning that your glucose levels are well-controlled. If two or more of your blood glucose levels are high, you may be diagnosed with gestational diabetes. If only one level is high, your health care  provider may suggest repeat testing or other tests to confirm a diagnosis. Talk with your health care provider about what your results mean. Questions to ask your health care provider Ask your health care provider, or the department that is doing the test:  When will my results be ready?  How will I get my results?  What are my treatment options?  What other tests do I need?  What are my next steps? Summary  The glucose tolerance test (GTT) is one of several tests used to diagnose diabetes that develops during pregnancy (gestational diabetes mellitus). Gestational diabetes is a temporary form of diabetes that some women develop during pregnancy.  You may have the GTT test after having a 1-hour glucose screening test if the results from that test indicate that you may have gestational diabetes. You may also have this test if you have any symptoms or risk factors for gestational diabetes.  Talk with your health care provider about what your results mean. This information is not intended to replace advice given to you by your health care provider. Make sure you discuss any questions you have with your health care provider. Document Revised: 12/04/2018 Document Reviewed: 03/25/2017 Elsevier Patient Education  2020 Elsevier Inc.  

## 2020-05-24 LAB — GLUCOSE TOLERANCE, 2 HOURS W/ 1HR
Glucose, 1 hour: 108 mg/dL (ref 65–179)
Glucose, 2 hour: 73 mg/dL (ref 65–152)
Glucose, Fasting: 68 mg/dL (ref 65–91)

## 2020-05-24 LAB — RPR: RPR Ser Ql: NONREACTIVE

## 2020-05-24 LAB — CBC
Hematocrit: 32.3 % — ABNORMAL LOW (ref 34.0–46.6)
Hemoglobin: 10.5 g/dL — ABNORMAL LOW (ref 11.1–15.9)
MCH: 29.4 pg (ref 26.6–33.0)
MCHC: 32.5 g/dL (ref 31.5–35.7)
MCV: 91 fL (ref 79–97)
Platelets: 277 10*3/uL (ref 150–450)
RBC: 3.57 x10E6/uL — ABNORMAL LOW (ref 3.77–5.28)
RDW: 12.7 % (ref 11.7–15.4)
WBC: 8 10*3/uL (ref 3.4–10.8)

## 2020-05-24 LAB — HIV ANTIBODY (ROUTINE TESTING W REFLEX): HIV Screen 4th Generation wRfx: NONREACTIVE

## 2020-06-06 ENCOUNTER — Ambulatory Visit (INDEPENDENT_AMBULATORY_CARE_PROVIDER_SITE_OTHER): Payer: Medicaid Other | Admitting: Student

## 2020-06-06 ENCOUNTER — Other Ambulatory Visit: Payer: Self-pay

## 2020-06-06 VITALS — BP 123/76 | HR 92 | Wt 168.3 lb

## 2020-06-06 DIAGNOSIS — Z3A28 28 weeks gestation of pregnancy: Secondary | ICD-10-CM

## 2020-06-06 DIAGNOSIS — Z3480 Encounter for supervision of other normal pregnancy, unspecified trimester: Secondary | ICD-10-CM

## 2020-06-06 NOTE — Progress Notes (Signed)
   PRENATAL VISIT NOTE  Subjective:  Beverly Mckenzie is a 19 y.o. G1P0 at [redacted]w[redacted]d being seen today for ongoing prenatal care.  She is currently monitored for the following issues for this low-risk pregnancy and has Supervision of other normal pregnancy, antepartum; Fibroid, uterine; Asthma; and Genetic carrier on their problem list.  Patient reports no complaints. Patient has talked to [place] rep and feels much better about her [facility] results. Is not worried about Hurler Syndrome, after convo with [employer] rep. She also knows that SMA risk is low, and that the only way to know for sure is through amniocentisis. She will try to get FOB tested when he comes back from [employer].  Contractions: Not present. Vag. Bleeding: None.  Movement: Present. Denies leaking of fluid.   The following portions of the patient's history were reviewed and updated as appropriate: allergies, current medications, past family history, past medical history, past social history, past surgical history and problem list.   Objective:   Vitals:   06/06/20 1442  BP: 123/76  Pulse: 92  Weight: 168 lb 4.8 oz (76.3 kg)    Fetal Status: Fetal Heart Rate (bpm): 145 Fundal Height: 29 cm Movement: Present     General:  Alert, oriented and cooperative. Patient is in no acute distress.  Skin: Skin is warm and dry. No rash noted.   Cardiovascular: Normal heart rate noted  Respiratory: Normal respiratory effort, no problems with respiration noted  Abdomen: Soft, gravid, appropriate for gestational age.  Pain/Pressure: Present     Pelvic: Cervical exam deferred        Extremities: Normal range of motion.  Edema: None  Mental Status: Normal mood and affect. Normal behavior. Normal judgment and thought content.   Assessment and Plan:  Pregnancy: G1P0 at [redacted]w[redacted]d  1. Supervision of other normal pregnancy, antepartum   2. [redacted] weeks gestation of pregnancy    -Discussed labor support; she is thinking she wants her mom to be in the room.    -Discussed genetic results, patient is reassured after phone call with [facility].  _patient agrees to start checking her blood pressure and will enter it into BabyRx.   Preterm labor symptoms and general obstetric precautions including but not limited to vaginal bleeding, contractions, leaking of fluid and fetal movement were reviewed in detail with the patient. Please refer to After Visit Summary for other counseling recommendations.   Return in about 2 weeks (around 06/20/2020), or LROB on my chart with KK.  Future Appointments  Date Time Provider [place]  07/01/2020  8:55 AM Starr Lake, CNM [facility]    Mervyn Skeeters [place], [place]

## 2020-06-15 ENCOUNTER — Encounter: Payer: Self-pay | Admitting: *Deleted

## 2020-06-27 ENCOUNTER — Inpatient Hospital Stay (HOSPITAL_COMMUNITY): Payer: Medicaid Other

## 2020-06-27 ENCOUNTER — Encounter (HOSPITAL_COMMUNITY): Payer: Self-pay | Admitting: Obstetrics and Gynecology

## 2020-06-27 ENCOUNTER — Other Ambulatory Visit: Payer: Self-pay

## 2020-06-27 ENCOUNTER — Inpatient Hospital Stay (HOSPITAL_COMMUNITY)
Admission: AD | Admit: 2020-06-27 | Discharge: 2020-06-28 | Disposition: A | Discharge status: Home / Self Care | Payer: Medicaid Other | Attending: Obstetrics and Gynecology | Admitting: Obstetrics and Gynecology

## 2020-06-27 DIAGNOSIS — R0789 Other chest pain: Secondary | ICD-10-CM

## 2020-06-27 DIAGNOSIS — Z20822 Contact with and (suspected) exposure to covid-19: Secondary | ICD-10-CM | POA: Diagnosis not present

## 2020-06-27 DIAGNOSIS — O26893 Other specified pregnancy related conditions, third trimester: Secondary | ICD-10-CM | POA: Diagnosis not present

## 2020-06-27 DIAGNOSIS — R109 Unspecified abdominal pain: Secondary | ICD-10-CM | POA: Insufficient documentation

## 2020-06-27 DIAGNOSIS — Z3A31 31 weeks gestation of pregnancy: Secondary | ICD-10-CM | POA: Diagnosis not present

## 2020-06-27 DIAGNOSIS — R0602 Shortness of breath: Secondary | ICD-10-CM | POA: Diagnosis not present

## 2020-06-27 DIAGNOSIS — O99891 Other specified diseases and conditions complicating pregnancy: Secondary | ICD-10-CM | POA: Diagnosis not present

## 2020-06-27 DIAGNOSIS — R011 Cardiac murmur, unspecified: Secondary | ICD-10-CM

## 2020-06-27 DIAGNOSIS — O99513 Diseases of the respiratory system complicating pregnancy, third trimester: Secondary | ICD-10-CM | POA: Diagnosis not present

## 2020-06-27 DIAGNOSIS — J45909 Unspecified asthma, uncomplicated: Secondary | ICD-10-CM | POA: Insufficient documentation

## 2020-06-27 DIAGNOSIS — N949 Unspecified condition associated with female genital organs and menstrual cycle: Secondary | ICD-10-CM

## 2020-06-27 DIAGNOSIS — R102 Pelvic and perineal pain: Secondary | ICD-10-CM | POA: Insufficient documentation

## 2020-06-27 LAB — CBC WITH DIFFERENTIAL/PLATELET
Abs Immature Granulocytes: 0.06 10*3/uL (ref 0.00–0.07)
Basophils Absolute: 0 10*3/uL (ref 0.0–0.1)
Basophils Relative: 0 %
Eosinophils Absolute: 0.1 10*3/uL (ref 0.0–0.5)
Eosinophils Relative: 1 %
HCT: 30.1 % — ABNORMAL LOW (ref 36.0–46.0)
Hemoglobin: 9.9 g/dL — ABNORMAL LOW (ref 12.0–15.0)
Immature Granulocytes: 1 %
Lymphocytes Relative: 16 %
Lymphs Abs: 1.5 10*3/uL (ref 0.7–4.0)
MCH: 28.6 pg (ref 26.0–34.0)
MCHC: 32.9 g/dL (ref 30.0–36.0)
MCV: 87 fL (ref 80.0–100.0)
Monocytes Absolute: 0.7 10*3/uL (ref 0.1–1.0)
Monocytes Relative: 7 %
Neutro Abs: 7.2 10*3/uL (ref 1.7–7.7)
Neutrophils Relative %: 75 %
Platelets: 256 10*3/uL (ref 150–400)
RBC: 3.46 MIL/uL — ABNORMAL LOW (ref 3.87–5.11)
RDW: 13.2 % (ref 11.5–15.5)
WBC: 9.7 10*3/uL (ref 4.0–10.5)
nRBC: 0 % (ref 0.0–0.2)

## 2020-06-27 LAB — COMPREHENSIVE METABOLIC PANEL
ALT: 17 U/L (ref 0–44)
AST: 19 U/L (ref 15–41)
Albumin: 2.9 g/dL — ABNORMAL LOW (ref 3.5–5.0)
Alkaline Phosphatase: 76 U/L (ref 38–126)
Anion gap: 9 (ref 5–15)
BUN: 6 mg/dL (ref 6–20)
CO2: 22 mmol/L (ref 22–32)
Calcium: 9.2 mg/dL (ref 8.9–10.3)
Chloride: 105 mmol/L (ref 98–111)
Creatinine, Ser: 0.44 mg/dL (ref 0.44–1.00)
GFR, Estimated: >60 mL/min (ref >60)
Glucose, Bld: 81 mg/dL (ref 70–99)
Potassium: 3.7 mmol/L (ref 3.5–5.1)
Sodium: 136 mmol/L (ref 135–145)
Total Bilirubin: 0.6 mg/dL (ref 0.3–1.2)
Total Protein: 6.1 g/dL — ABNORMAL LOW (ref 6.5–8.1)

## 2020-06-27 LAB — URINALYSIS, ROUTINE W REFLEX MICROSCOPIC
Bilirubin Urine: NEGATIVE
Glucose, UA: NEGATIVE mg/dL
Hgb urine dipstick: NEGATIVE
Ketones, ur: NEGATIVE mg/dL
Leukocytes,Ua: NEGATIVE
Nitrite: NEGATIVE
Protein, ur: NEGATIVE mg/dL
Specific Gravity, Urine: 1.004 — ABNORMAL LOW (ref 1.005–1.030)
pH: 7 (ref 5.0–8.0)

## 2020-06-27 LAB — RESPIRATORY PANEL BY RT PCR (FLU A&B, COVID)
Influenza A by PCR: NEGATIVE
Influenza B by PCR: NEGATIVE
SARS Coronavirus 2 by RT PCR: NEGATIVE

## 2020-06-27 LAB — BRAIN NATRIURETIC PEPTIDE: B Natriuretic Peptide: 35.8 pg/mL (ref 0.0–100.0)

## 2020-06-27 LAB — TROPONIN I (HIGH SENSITIVITY): Troponin I (High Sensitivity): 3 ng/L (ref <18)

## 2020-06-27 MED ORDER — CYCLOBENZAPRINE HCL 5 MG PO TABS
10.0000 mg | ORAL_TABLET | Freq: Once | ORAL | Status: AC
  Administered 2020-06-27: 10 mg via ORAL
  Filled 2020-06-27: qty 2

## 2020-06-27 NOTE — MAU Provider Note (Signed)
Chief Complaint:  Abdominal Pain   First Provider Initiated Contact with Patient 06/27/20 2152     HPI: Beverly Mckenzie is a 19 y.o. G1P0 at [redacted]w[redacted]d who presents to maternity admissions reporting difficulty taking a deep breath without tightness across top of abdomen and pulling sensation/pain in her lower abdomen. The feeling makes it difficult for her to take a deep breath causing shallow breathing which feels like SOB. She has seasonal allergies that cause reactive airway, she states this feels similarly but her inhaler has not provided relief. She has been vaccinated against Covid and not around anyone ill in recent days. Denies vaginal bleeding or discharge, leaking of fluid, decreased fetal movement, fever, falls, or recent illness.    Past Medical History:  Diagnosis Date  . Asthma   . Depression    OB History  Gravida Para Term Preterm AB Living  1            SAB TAB Ectopic Multiple Live Births               # Outcome Date GA Lbr Len/2nd Weight Sex Delivery Anes PTL Lv  1 Current            Past Surgical History:  Procedure Laterality Date  . NO PAST SURGERIES     Family History  Problem Relation Age of Onset  . Healthy Mother   . Healthy Father    Social History   Tobacco Use  . Smoking status: Never Smoker  . Smokeless tobacco: Never Used  Vaping Use  . Vaping Use: Never used  Substance Use Topics  . Alcohol use: No  . Drug use: No   Allergies  Allergen Reactions  . 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 Cat Hair Extract Hives and Swelling  . Dog Epithelium Allergy Skin Test Hives and Swelling   No medications prior to admission.    I have reviewed patient's Past Medical Hx, Surgical Hx, Family Hx, Social Hx, medications and allergies.   ROS:  Review of Systems  Constitutional: Negative for appetite change, fatigue and fever.  HENT: Negative for congestion,  postnasal drip, sinus pressure, sinus pain and sore throat.   Eyes: Negative for photophobia and visual disturbance.  Respiratory: Positive for chest tightness ("feels like when I have seasonal allergies but my inhaler doesn't work") and shortness of breath (having pain with inspiration which makes her feel SOB). Negative for cough, choking, wheezing and stridor.   Cardiovascular: Negative for palpitations.  Gastrointestinal: Negative for nausea and vomiting.  Genitourinary: Positive for pelvic pain (aggravated by movement). Negative for dysuria, flank pain, urgency, vaginal bleeding, vaginal discharge and vaginal pain.  Musculoskeletal: Positive for back pain.  Neurological: Negative for dizziness, syncope and headaches.  All other systems reviewed and are negative.   Physical Exam   Patient Vitals for the past 24 hrs:  BP Temp Pulse Resp Height Weight  06/28/20 0047 111/74 -- 75 18 -- --  06/27/20 2052 122/76 98.2 F (36.8 C) 82 18 5\' 7"  (1.702 m) 173 lb (78.5 kg)   Constitutional: Well-developed, well-nourished female in no acute distress.  Cardiovascular: normal rate & rhythm, loud systolic murmur heard Respiratory: normal effort, lung sounds clear throughout GI: Abd soft, tender in lower abdomen over areas where fetal parts are palpated (shoulder down low near the left round ligament), gravid appropriate for gestational age. Pos BS x  4 MS: Extremities nontender, no edema, normal ROM Neurologic: Alert and oriented x 4.  Pelvic exam deferred  Fetal Tracing: reactive Baseline: 135 Variability: moderate Accelerations: present Decelerations: none Toco: UI   Labs: Results for orders placed or performed during the hospital encounter of 06/27/20 (from the past 24 hour(s))  Urinalysis, Routine w reflex microscopic Urine, Clean Catch     Status: Abnormal   Collection Time: 06/27/20  8:55 PM  Result Value Ref Range   Color, Urine STRAW (A) YELLOW   APPearance CLEAR CLEAR   Specific  Gravity, Urine 1.004 (L) 1.005 - 1.030   pH 7.0 5.0 - 8.0   Glucose, UA NEGATIVE NEGATIVE mg/dL   Hgb urine dipstick NEGATIVE NEGATIVE   Bilirubin Urine NEGATIVE NEGATIVE   Ketones, ur NEGATIVE NEGATIVE mg/dL   Protein, ur NEGATIVE NEGATIVE mg/dL   Nitrite NEGATIVE NEGATIVE   Leukocytes,Ua NEGATIVE NEGATIVE  CBC with Differential/Platelet     Status: Abnormal   Collection Time: 06/27/20 10:39 PM  Result Value Ref Range   WBC 9.7 4.0 - 10.5 K/uL   RBC 3.46 (L) 3.87 - 5.11 MIL/uL   Hemoglobin 9.9 (L) 12.0 - 15.0 g/dL   HCT 30.1 (L) 36 - 46 %   MCV 87.0 80.0 - 100.0 fL   MCH 28.6 26.0 - 34.0 pg   MCHC 32.9 30.0 - 36.0 g/dL   RDW 13.2 11.5 - 15.5 %   Platelets 256 150 - 400 K/uL   nRBC 0.0 0.0 - 0.2 %   Neutrophils Relative % 75 %   Neutro Abs 7.2 1.7 - 7.7 K/uL   Lymphocytes Relative 16 %   Lymphs Abs 1.5 0.7 - 4.0 K/uL   Monocytes Relative 7 %   Monocytes Absolute 0.7 0.1 - 1.0 K/uL   Eosinophils Relative 1 %   Eosinophils Absolute 0.1 0.0 - 0.5 K/uL   Basophils Relative 0 %   Basophils Absolute 0.0 0.0 - 0.1 K/uL   Immature Granulocytes 1 %   Abs Immature Granulocytes 0.06 0.00 - 0.07 K/uL  Brain natriuretic peptide     Status: None   Collection Time: 06/27/20 10:39 PM  Result Value Ref Range   B Natriuretic Peptide 35.8 0.0 - 100.0 pg/mL  Comprehensive metabolic panel     Status: Abnormal   Collection Time: 06/27/20 10:39 PM  Result Value Ref Range   Sodium 136 135 - 145 mmol/L   Potassium 3.7 3.5 - 5.1 mmol/L   Chloride 105 98 - 111 mmol/L   CO2 22 22 - 32 mmol/L   Glucose, Bld 81 70 - 99 mg/dL   BUN 6 6 - 20 mg/dL   Creatinine, Ser 0.44 0.44 - 1.00 mg/dL   Calcium 9.2 8.9 - 10.3 mg/dL   Total Protein 6.1 (L) 6.5 - 8.1 g/dL   Albumin 2.9 (L) 3.5 - 5.0 g/dL   AST 19 15 - 41 U/L   ALT 17 0 - 44 U/L   Alkaline Phosphatase 76 38 - 126 U/L   Total Bilirubin 0.6 0.3 - 1.2 mg/dL   GFR, Estimated >60 >60 mL/min   Anion gap 9 5 - 15  Troponin I (High Sensitivity)      Status: None   Collection Time: 06/27/20 10:39 PM  Result Value Ref Range   Troponin I (High Sensitivity) 3 <18 ng/L  Respiratory Panel by RT PCR (Flu A&B, Covid) - Nasopharyngeal Swab     Status: None   Collection Time: 06/27/20 10:50 PM   Specimen:  Nasopharyngeal Swab  Result Value Ref Range   SARS Coronavirus 2 by RT PCR NEGATIVE NEGATIVE   Influenza A by PCR NEGATIVE NEGATIVE   Influenza B by PCR NEGATIVE NEGATIVE    Imaging:  DG CHEST PORT 1 VIEW  Result Date: 06/27/2020 CLINICAL DATA:  Short of breath, pregnant EXAM: PORTABLE CHEST 1 VIEW COMPARISON:  None. FINDINGS: The heart size and mediastinal contours are within normal limits. Both lungs are clear. The visualized skeletal structures are unremarkable. IMPRESSION: No active disease. Electronically Signed   By: Randa Ngo M.D.   On: 06/27/2020 22:18    MAU Course: Orders Placed This Encounter  Procedures  . Respiratory Panel by RT PCR (Flu A&B, Covid) - Nasopharyngeal Swab  . DG CHEST PORT 1 VIEW  . Urinalysis, Routine w reflex microscopic Urine, Clean Catch  . CBC with Differential/Platelet  . Brain natriuretic peptide  . Comprehensive metabolic panel  . Droplet precaution  . ED EKG  . Discharge patient   Meds ordered this encounter  Medications  . cyclobenzaprine (FLEXERIL) tablet 10 mg    MDM: Main complaint = SOB, Covid workup negative EKG abnormal - consulted with cardio fellow who acknowledged t-wave inversion and presence of murmur despite no previous diagnosis. Advised for 12-lead EKG after delivery to establish pt's baseline, but no additional workup at this time since her labs are normal, lungs are clear, and murmurs in the 3rd trimester can be a normal finding of pregnancy  Pt's description of pain suggestive of musculoskeletal pain so flexeril given with some relief. Reviewed round ligament massage and stretches to ease abdominal muscles as uterus grows. Pt amenable to suggestions and ready to go  home.  Assessment: 1. Round ligament pain   2. Shortness of breath at rest   3. Heart murmur on physical examination    Plan: Discharge home in stable condition.  Next prenatal visit may need to be in person, advised pt to call and switch to in-person if no relief of pain. Advised to wear pregnancy belt for support of uterus    Follow-up [facility]. Go to.   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   cyclobenzaprine 10 MG tablet Commonly known as: FLEXERIL Take 1 tablet (10 mg total) by mouth every 8 (eight) hours as needed for muscle spasms.   prenatal multivitamin Tabs tablet Take 1 tablet by mouth daily at 12 noon.      Gaylan Gerold, CNM, MSN, [facility] 06/28/20 2:28 AM

## 2020-06-27 NOTE — MAU Note (Signed)
Pt reports she is having sharp pain in he upper abd and radiates towards her back. Pain is worse when she sits up. Feels better when she lays down. Good fetal movement reported. Denies any vag bleeding leaking or discharge at this time

## 2020-06-28 NOTE — Discharge Instructions (Signed)
Heart Murmur A heart murmur is an extra sound that is caused by chaotic blood flow through the valves of the heart. The murmur can be heard as a "hum" or "whoosh" sound when blood flows through the heart. There are two types of heart murmurs:  Innocent (benign) murmurs. Most people with this type of heart murmur do not have a heart problem. Many children have innocent heart murmurs. Your health care provider may suggest some basic tests to find out whether your murmur is an innocent murmur. If an innocent heart murmur is found, there is no need for further tests or treatment and no need to restrict activities or stop playing sports.  Abnormal murmurs. These types of murmurs can occur in children and adults. Abnormal murmurs may be a sign of a more serious heart condition, such as a heart defect present at birth (congenital defect) or heart valve disease. What are the causes?  The heart has four areas called chambers. Valves separate the upper and lower chambers from each other (tricuspid valve and mitral valve) and separate the lower chambers of the heart from pathways that lead away from the heart (aortic valve and pulmonary valve). Normally, the valves open to let blood flow through or out of your heart, and then they shut to keep the blood from flowing backward. This condition is caused by heart valves that are not working properly.  In children, abnormal heart murmurs are typically caused by congenital defects.  In adults, abnormal murmurs are usually caused by heart valve problems from disease, infection, or aging. This condition may also be caused by:  Pregnancy.  Fever.  Overactive thyroid gland.  Anemia.  Exercise.  Rapid growth spurts (in children). What are the signs or symptoms? Innocent murmurs do not cause symptoms, and many people with abnormal murmurs may not have symptoms. If symptoms do develop, they may include:  Shortness of breath.  Blue coloring of the skin,  especially on the fingertips.  Chest pain.  Palpitations, or feeling a fluttering or skipped heartbeat.  Fainting.  Persistent cough.  Getting tired much faster than expected.  Swelling in the abdomen, feet, or ankles. How is this diagnosed? This condition may be diagnosed during a routine physical or other exam. If your health care provider hears a murmur with a stethoscope, he or she will listen for:  Where the murmur is located in your heart.  How long the murmur lasts (duration).  When the murmur is heard during the heartbeat.  How loud the murmur is. This may help the health care provider figure out what is causing the murmur. You may be referred to a heart specialist (cardiologist). You may also have other tests, including:  Electrocardiogram (ECG or EKG). This test measures the electrical activity of your heart.  Echocardiogram. This test uses high frequency sound waves to make pictures of your heart.  MRI or chest X-ray.  Cardiac catheterization. This test looks at blood flow through the arteries around the heart. For children and adults who have an abnormal heart murmur and want to stay active, it is important to:  Complete testing.  Review test results.  Receive recommendations from your health care provider. If heart disease is present, it may not be safe to play or be active. How is this treated? Heart murmurs themselves do not need treatment. In some cases, a heart murmur may go away on its own. If an underlying problem or disease is causing the murmur, you may need treatment. If treatment  is needed, it will depend on the type and severity of the disease or heart problem causing the murmur. Treatment may include:  Medicine.  Surgery.  Dietary and lifestyle changes. Follow these instructions at home:  Talk with your health care provider before participating in sports or other activities that require a lot of effort and energy (are strenuous).  Learn as  much as possible about your condition and any related diseases. Ask your health care provider if you may be at risk for any medical emergencies.  Talk with your health care provider about what symptoms you should look out for.  It is up to you to get your test results. Ask your health care provider, or the department that is doing the test, when your results will be ready.  Keep all follow-up visits as told by your health care provider. This is important. Contact a health care provider if:  You are frequently short of breath.  You feel more tired than usual.  You are having a hard time keeping up with normal activities or fitness routines.  You have swelling in your ankles or feet.  You notice that your heart often beats irregularly.  You develop any new symptoms. Get help right away if:  You have chest pain.  You are having trouble breathing.  You feel light-headed or you pass out.  Your symptoms suddenly get worse. These symptoms may represent a serious problem that is an emergency. Do not wait to see if the symptoms will go away. Get medical help right away. Call your local emergency services (911 in the [place] Do not drive yourself to the hospital. Summary  Normally, the heart valves open to let blood flow through or out of your heart, and then they shut to keep the blood from flowing backward.  A heart murmur is caused by heart valves that are not working properly.  You may need treatment if an underlying problem or disease is causing the heart murmur. Treatment may include medicine, surgery, or dietary and lifestyle changes.  Talk with your health care provider before participating in sports or other activities that require a lot of effort and energy (are strenuous).  Talk with your health care provider about what symptoms you should watch out for. This information is not intended to replace advice given to you by your health care provider. Make sure you discuss any  questions you have with your health care provider. Document Revised: 02/04/2018 Document Reviewed: 02/04/2018 Elsevier Patient Education  [place].   Round Ligament Pain  The round ligament is a cord of muscle and tissue that helps support the uterus. It can become a source of pain during pregnancy if it becomes stretched or twisted as the baby grows. The pain usually begins in the second trimester (13-28 weeks) of pregnancy, and it can come and go until the baby is delivered. It is not a serious problem, and it does not cause harm to the baby. Round ligament pain is usually a short, sharp, and pinching pain, but it can also be a dull, lingering, and aching pain. The pain is felt in the lower side of the abdomen or in the groin. It usually starts deep in the groin and moves up to the outside of the hip area. The pain may occur when you:  Suddenly change position, such as quickly going from a sitting to standing position.  Roll over in bed.  Cough or sneeze.  Do physical activity. Follow these instructions at  home:   Watch your condition for any changes.  When the pain starts, relax. Then try any of these methods to help with the pain: ? Sitting down. ? Flexing your knees up to your abdomen. ? Lying on your side with one pillow under your abdomen and another pillow between your legs. ? Sitting in a warm bath for 15-20 minutes or until the pain goes away.  Take over-the-counter and prescription medicines only as told by your health care provider.  Move slowly when you sit down or stand up.  Avoid long walks if they cause pain.  Stop or reduce your physical activities if they cause pain.  Keep all follow-up visits as told by your health care provider. This is important. Contact a health care provider if:  Your pain does not go away with treatment.  You feel pain in your back that you did not have before.  Your medicine is not helping. Get help right away if:  You  have a fever or chills.  You develop uterine contractions.  You have vaginal bleeding.  You have nausea or vomiting.  You have diarrhea.  You have pain when you urinate. Summary  Round ligament pain is felt in the lower abdomen or groin. It is usually a short, sharp, and pinching pain. It can also be a dull, lingering, and aching pain.  This pain usually begins in the second trimester (13-28 weeks). It occurs because the uterus is stretching with the growing baby, and it is not harmful to the baby.  You may notice the pain when you suddenly change position, when you cough or sneeze, or during physical activity.  Relaxing, flexing your knees to your abdomen, lying on one side, or taking a warm bath may help to get rid of the pain.  Get help from your health care provider if the pain does not go away or if you have vaginal bleeding, nausea, vomiting, diarrhea, or painful urination. This information is not intended to replace advice given to you by your health care provider. Make sure you discuss any questions you have with your health care provider. Document Revised: 01/29/2018 Document Reviewed: 01/29/2018 Elsevier Patient Education  [place].

## 2020-06-29 ENCOUNTER — Other Ambulatory Visit: Payer: Self-pay

## 2020-06-29 ENCOUNTER — Ambulatory Visit (INDEPENDENT_AMBULATORY_CARE_PROVIDER_SITE_OTHER): Payer: Medicaid Other | Admitting: *Deleted

## 2020-06-29 VITALS — BP 119/78 | HR 118 | Ht 67.0 in | Wt 169.8 lb

## 2020-06-29 DIAGNOSIS — O26893 Other specified pregnancy related conditions, third trimester: Secondary | ICD-10-CM

## 2020-06-29 DIAGNOSIS — R519 Headache, unspecified: Secondary | ICD-10-CM

## 2020-06-29 MED ORDER — BUTALBITAL-APAP-CAFFEINE 50-325-40 MG PO TABS: 1.0000 | ORAL_TABLET | Freq: Four times a day (QID) | ORAL | 0 refills | Status: DC | PRN

## 2020-06-29 NOTE — Progress Notes (Addendum)
States blood pressure was elevated Monday and went to MAU for SOB , nausea and headaches.  Today states bp was 143/84, and 140/94 at home today. C/o still having SOB she when she was at MAU. C/o headache since last evening =8, took tylenol regular strength last night and today and it didn't help. States headache is still =8. Trace edema noted in feet/ none in hands/ face. Bp in office 119/78 with oxygen saturation 100%. Reports good fetal movement. Discussed with Dr. Jimmye Norman and chart reviewed. Orders for fioricet given. Advised to keep already scheduled ob fu for 07/01/20. Advised if headache unbearable or edema worsens to go to hospital. She voices understanding.

## 2020-06-29 NOTE — Addendum Note (Signed)
Addended by: Juanna Cao on: 06/29/2020 04:12 PM   Modules accepted: Orders

## 2020-07-01 ENCOUNTER — Telehealth (INDEPENDENT_AMBULATORY_CARE_PROVIDER_SITE_OTHER): Payer: Medicaid Other | Admitting: Student

## 2020-07-01 VITALS — BP 122/80 | HR 98 | Wt 156.4 lb

## 2020-07-01 DIAGNOSIS — Z3A32 32 weeks gestation of pregnancy: Secondary | ICD-10-CM

## 2020-07-01 DIAGNOSIS — Z3483 Encounter for supervision of other normal pregnancy, third trimester: Secondary | ICD-10-CM

## 2020-07-01 DIAGNOSIS — Z3480 Encounter for supervision of other normal pregnancy, unspecified trimester: Secondary | ICD-10-CM

## 2020-07-01 MED ORDER — IRON (FERROUS SULFATE) 325 (65 FE) MG PO TABS: 1.0000 | ORAL_TABLET | Freq: Every day | ORAL | 3 refills | Status: DC

## 2020-07-01 NOTE — Progress Notes (Signed)
Has questions about heart murmer

## 2020-07-01 NOTE — Progress Notes (Signed)
Patient ID: Beverly Mckenzie, female   DOB: [birth year], 19 y.o.   MRN: [id number]   PRENATAL VISIT NOTE  Subjective:  Beverly Mckenzie is a 19 y.o. G1P0 at [redacted]w[redacted]d being seen today for ongoing prenatal care.  She is currently monitored for the following issues for this low-risk pregnancy and has Supervision of other normal pregnancy, antepartum; Fibroid, uterine; Asthma; and Genetic carrier on their problem list.  Patient reports that last night she felt like she couldn't breathe. This is similar to how she felt on Monday when she went to MAU.   This morning she still has a HA; it is persistant. It gets progressively worse throughout the day.  She tried her inhaler on Monday she can't tell if it helped or not.  She reports that her diet consists of pizza and fruit and she doesn't have any interests in other food.  .  Contractions: Not present. Vag. Bleeding: None.  Movement: Present. Denies leaking of fluid.   The following portions of the patient's history were reviewed and updated as appropriate: allergies, current medications, past family history, past medical history, past social history, past surgical history and problem list.   Objective:   Vitals:   07/01/20 0853  BP: 122/80  Pulse: 98  Weight: 156 lb 6.4 oz (70.9 kg)    Fetal Status: Fetal Heart Rate (bpm): 147 Fundal Height: 33 cm Movement: Present     General:  Alert, oriented and cooperative. Patient is in no acute distress.  Skin: Skin is warm and dry. No rash noted.   Cardiovascular: Normal heart rate noted  Respiratory: Normal respiratory effort, no problems with respiration noted  Abdomen: Soft, gravid, appropriate for gestational age.  Pain/Pressure: Present     Pelvic: Cervical exam deferred        Extremities: Normal range of motion.  Edema: None  Mental Status: Normal mood and affect. Normal behavior. Normal judgment and thought content.   Assessment and Plan:  Pregnancy: G1P0 at [redacted]w[redacted]d 1. Supervision of other normal  pregnancy, antepartum -Has had elevated blood pressures at home but all normal on file here. COnsider that her blood pressure is elevated due to anxiety as no elevated BP once in MAU.  -Cardiology referral made-patient given number to call to follow up if not heard from cardiology in 1 week -Reviewed importance of eating well and eating protein; encouraged iron pills every other day as drop in Hemoglobin could be why she is having headaches and not feeling well.   -Unsure if SOB is due to asthma exacerbation, continue to monitor. If using inhaler more, document frequency and will follow up at next visit with MD>   Preterm labor symptoms and general obstetric precautions including but not limited to vaginal bleeding, contractions, leaking of fluid and fetal movement were reviewed in detail with the patient. Please refer to After Visit Summary for other counseling recommendations.   Return in about 2 weeks (around 07/15/2020), or HROB with MD then can go back to midwive schedule.  Future Appointments  Date Time Provider [place]  07/15/2020 10:35 AM Griffin Basil, MD [facility]    [place], [place]

## 2020-07-01 NOTE — Patient Instructions (Signed)
[address], [place], [place] [address] Hours:  Open ? Closes 5PM Phone: [phone]

## 2020-07-04 ENCOUNTER — Ambulatory Visit (INDEPENDENT_AMBULATORY_CARE_PROVIDER_SITE_OTHER): Payer: Medicaid Other | Admitting: Internal Medicine

## 2020-07-04 ENCOUNTER — Encounter: Payer: Self-pay | Admitting: Internal Medicine

## 2020-07-04 ENCOUNTER — Other Ambulatory Visit: Payer: Self-pay

## 2020-07-04 VITALS — BP 102/70 | HR 90 | Ht 67.0 in | Wt 169.4 lb

## 2020-07-04 DIAGNOSIS — R0602 Shortness of breath: Secondary | ICD-10-CM | POA: Diagnosis not present

## 2020-07-04 DIAGNOSIS — R9431 Abnormal electrocardiogram [ECG] [EKG]: Secondary | ICD-10-CM

## 2020-07-04 DIAGNOSIS — J4520 Mild intermittent asthma, uncomplicated: Secondary | ICD-10-CM | POA: Diagnosis not present

## 2020-07-04 DIAGNOSIS — Z3A32 32 weeks gestation of pregnancy: Secondary | ICD-10-CM | POA: Diagnosis not present

## 2020-07-04 NOTE — Patient Instructions (Addendum)
Medication Instructions:  No Changes In Medications at this time.  *If you need a refill on your cardiac medications before your next appointment, please call your pharmacy*  Lab Work: None Ordered At This Time.  If you have labs (blood work) drawn today and your tests are completely normal, you will receive your results only by: Marland Kitchen MyChart Message (if you have MyChart) OR . A paper copy in the mail If you have any lab test that is abnormal or we need to change your treatment, we will call you to review the results.  Testing/Procedures: Your physician has requested that you have an echocardiogram. Echocardiography is a painless test that uses sound waves to create images of your heart. It provides your doctor with information about the size and shape of your heart and how well your heart's chambers and valves are working. You may receive an ultrasound enhancing agent through an IV if needed to better visualize your heart during the echo.This procedure takes approximately one hour. There are no restrictions for this procedure. This will take place at the [address], [address].   Follow-Up: At [facility], you and your health needs are our priority.  As part of our continuing mission to provide you with exceptional heart care, we have created designated Provider Care Teams.  These Care Teams include your primary Cardiologist (physician) and Advanced Practice Providers (APPs -  Physician Assistants and Nurse Practitioners) who all work together to provide you with the care you need, when you need it.  Your next appointment:   1 month(s)  The format for your next appointment:   Virtual Visit   Provider:   Cherlynn Kaiser, MD

## 2020-07-04 NOTE — Progress Notes (Signed)
Cardiology Office Note:    Date:  07/04/2020   ID:  Beverly Mckenzie, DOB 2001/08/22, MRN [id number]  PCP:  Patient, No Pcp Per  Cardiologist:  No primary care provider on file.  Electrophysiologist:  None   Referring MD: Luna Glasgow*   Chief Complaint/Reason for Referral: SOB, current pregnancy  History of Present Illness:    Beverly Mckenzie is a 19 y.o. female with a history of asthma and depression who presents for SOB during pregnancy. Per OB notes she is G1P0 and [redacted]w[redacted]d into pregnancy. Felt to be a low risk pregnancy. Due on Dec 24 with a baby boy.  She has had several episodes where she checks her blood pressure at home and it will be elevated in the 791T systolic.  She also notices that when she goes to sleep at night lately she will be short of breath.  It does not matter how she sleeps, what position she sleeps in.  She is also newly noted to have a murmur based on her OB/GYN office assessment.  We discussed that this can be a normal finding in pregnancy due to increased blood volume.  She has been found to have mild abnormality on EKG with nonspecific T wave abnormality.  Family history is significant for many family members with a heart murmur and she also notes a family history of sudden death.  She was the product of an uncomplicated pregnancy, but in early childhood had an episode of hypoxia where she "turned blue".  From what she can recall from her mother and grandmother story, she had reflux that caused her to turn blue and she had hypoxia for prolonged period of time.  I suspect from the story she may have had aspiration.  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  She was limited by her asthma in elementary school.  She has had uncomplicated asthma in the past several years and does not currently use an inhaler.  Recently found to be iron deficient and has started an iron supplement.  She is taking a prenatal vitamin.  Historically she has had sharp pains  in her chest, she did not know whether this is related to reflux or with something more concerning now with her findings of a heart murmur and nonspecific abnormalities on ECG.  She denies significant palpitations, PND, orthopnea.    She has a history of 2 syncopal episodes occurring in the setting of concussions from trauma.  No current syncope, dizziness, lightheadedness.  Past Medical History:  Diagnosis Date  . Asthma   . Depression     Past Surgical History:  Procedure Laterality Date  . NO PAST SURGERIES      Current Medications: Current Meds  Medication Sig  . Iron, Ferrous Sulfate, 325 (65 Fe) MG TABS Take 1 tablet by mouth daily.  . Prenatal Vit-Fe Fumarate-FA (PRENATAL MULTIVITAMIN) TABS tablet Take 1 tablet by mouth daily at 12 noon.     Allergies:   Justicia adhatoda (malabar nut tree) [justicia adhatoda], Cat hair extract, and Dog epithelium allergy skin test   Social History   Tobacco Use  . Smoking status: Never Smoker  . Smokeless tobacco: Never Used  Vaping Use  . Vaping Use: Never used  Substance Use Topics  . Alcohol use: No  . Drug use: No     Family History: The patient's family history includes Healthy in her father and mother.  ROS:   Please see the history of present illness.  All other systems reviewed and are negative.  EKGs/Labs/Other Studies Reviewed:    The following studies were reviewed today:  EKG: Normal sinus rhythm with sinus arrhythmia, nonspecific T wave abnormality.  Recent Labs: 06/27/2020: ALT 17; B Natriuretic Peptide 35.8; BUN 6; Creatinine, Ser 0.44; Hemoglobin 9.9; Platelets 256; Potassium 3.7; Sodium 136  Recent Lipid Panel No results found for: CHOL, TRIG, HDL, CHOLHDL, VLDL, LDLCALC, LDLDIRECT  Physical Exam:    VS:  BP 102/70 (BP Location: Right Arm, Patient Position: Sitting)   Pulse 90   Ht 5\' 7"  (1.702 m)   Wt 169 lb 6.4 oz (76.8 kg)   LMP 11/17/2019   SpO2 96%   BMI 26.53 kg/m     Wt Readings from  Last 5 Encounters:  07/04/20 169 lb 6.4 oz (76.8 kg) (92 %, Z= 1.42)*  07/01/20 156 lb 6.4 oz (70.9 kg) (87 %, Z= 1.11)*  06/29/20 169 lb 12.8 oz (77 kg) (92 %, Z= 1.43)*  06/27/20 173 lb (78.5 kg) (93 %, Z= 1.50)*  06/06/20 168 lb 4.8 oz (76.3 kg) (92 %, Z= 1.40)*   * Growth percentiles are based on CDC (Girls, 2-20 Years) data.    Constitutional: No acute distress Eyes: sclera non-icteric, normal conjunctiva and lids ENMT: normal dentition, moist mucous membranes Cardiovascular: regular rhythm, normal rate, 2/6 mid systolic murmur.  S1 and S2 normal. Radial pulses normal bilaterally. No jugular venous distention.  Respiratory: clear to auscultation bilaterally GI : normal bowel sounds, soft and nontender. No distention.   MSK: extremities warm, well perfused.  Trace bilateral lower extremity edema.  NEURO: grossly nonfocal exam, moves all extremities. PSYCH: alert and oriented x 3, normal mood and affect.   ASSESSMENT:    1. [redacted] weeks gestation of pregnancy   2. Mild intermittent asthma without complication   3. Shortness of breath   4. Nonspecific abnormal electrocardiogram (ECG) (EKG)    PLAN:    We discussed that her symptoms could be a normal part of pregnancy, but with murmur, and nonspecific T wave abnormality on ECG, we can obtain an echocardiogram to review structure and function.  This is also in the setting of her family having several people with heart murmur, and an uncle being discharged from the [facility] due to a heart murmur.  I would like to screen for bicuspid aortic valve.  We discussed that an echocardiogram is indicated to ensure heart health particularly with the episode of "blue baby" when she was very young.  Blood pressure is normal today.  No change to medication therapy required today from a cardiovascular perspective.   Cherlynn Kaiser, MD [place]  [facility]    Medication Adjustments/Labs and Tests Ordered: Current medicines are reviewed at  length with the patient today.  Concerns regarding medicines are outlined above.   Orders Placed This Encounter  Procedures  . EKG 12-Lead  . ECHOCARDIOGRAM COMPLETE    No orders of the defined types were placed in this encounter.   Patient Instructions  Medication Instructions:  No Changes In Medications at this time.  *If you need a refill on your cardiac medications before your next appointment, please call your pharmacy*  Lab Work: None Ordered At This Time.  If you have labs (blood work) drawn today and your tests are completely normal, you will receive your results only by: Marland Kitchen MyChart Message (if you have MyChart) OR . A paper copy in the mail If you have any lab test that is abnormal or we  need to change your treatment, we will call you to review the results.  Testing/Procedures: Your physician has requested that you have an echocardiogram. Echocardiography is a painless test that uses sound waves to create images of your heart. It provides your doctor with information about the size and shape of your heart and how well your heart's chambers and valves are working. You may receive an ultrasound enhancing agent through an IV if needed to better visualize your heart during the echo.This procedure takes approximately one hour. There are no restrictions for this procedure. This will take place at the [address], [address].   Follow-Up: At [facility], you and your health needs are our priority.  As part of our continuing mission to provide you with exceptional heart care, we have created designated Provider Care Teams.  These Care Teams include your primary Cardiologist (physician) and Advanced Practice Providers (APPs -  Physician Assistants and Nurse Practitioners) who all work together to provide you with the care you need, when you need it.  Your next appointment:   1 month(s)  The format for your next appointment:   Virtual Visit   Provider:   Cherlynn Kaiser,  MD

## 2020-07-14 ENCOUNTER — Encounter (HOSPITAL_COMMUNITY): Payer: Self-pay | Admitting: Obstetrics and Gynecology

## 2020-07-14 ENCOUNTER — Inpatient Hospital Stay (HOSPITAL_COMMUNITY)
Admission: AD | Admit: 2020-07-14 | Discharge: 2020-07-14 | Disposition: A | Discharge status: Home / Self Care | Payer: Medicaid Other | Attending: Obstetrics and Gynecology | Admitting: Obstetrics and Gynecology

## 2020-07-14 ENCOUNTER — Other Ambulatory Visit: Payer: Self-pay

## 2020-07-14 ENCOUNTER — Telehealth: Payer: Self-pay | Admitting: Lactation Services

## 2020-07-14 DIAGNOSIS — R109 Unspecified abdominal pain: Secondary | ICD-10-CM | POA: Diagnosis present

## 2020-07-14 DIAGNOSIS — Z3A34 34 weeks gestation of pregnancy: Secondary | ICD-10-CM | POA: Diagnosis not present

## 2020-07-14 DIAGNOSIS — Z3689 Encounter for other specified antenatal screening: Secondary | ICD-10-CM

## 2020-07-14 DIAGNOSIS — Z3480 Encounter for supervision of other normal pregnancy, unspecified trimester: Secondary | ICD-10-CM

## 2020-07-14 DIAGNOSIS — O4703 False labor before 37 completed weeks of gestation, third trimester: Secondary | ICD-10-CM | POA: Diagnosis not present

## 2020-07-14 LAB — WET PREP, GENITAL
Clue Cells Wet Prep HPF POC: NONE SEEN
Sperm: NONE SEEN
Trich, Wet Prep: NONE SEEN
Yeast Wet Prep HPF POC: NONE SEEN

## 2020-07-14 LAB — URINALYSIS, ROUTINE W REFLEX MICROSCOPIC
Bilirubin Urine: NEGATIVE
Glucose, UA: NEGATIVE mg/dL
Hgb urine dipstick: NEGATIVE
Ketones, ur: NEGATIVE mg/dL
Leukocytes,Ua: NEGATIVE
Nitrite: NEGATIVE
Protein, ur: NEGATIVE mg/dL
Specific Gravity, Urine: 1.009 (ref 1.005–1.030)
pH: 7 (ref 5.0–8.0)

## 2020-07-14 MED ORDER — LACTATED RINGERS IV BOLUS
1000.0000 mL | Freq: Once | INTRAVENOUS | Status: AC
  Administered 2020-07-14: 1000 mL via INTRAVENOUS

## 2020-07-14 MED ORDER — TERBUTALINE SULFATE 1 MG/ML IJ SOLN
0.2500 mg | Freq: Once | INTRAMUSCULAR | Status: AC
  Administered 2020-07-14: 0.25 mg via SUBCUTANEOUS
  Filled 2020-07-14: qty 1

## 2020-07-14 NOTE — MAU Provider Note (Addendum)
History    CSN: [id number]  Arrival date and time: 07/14/20 1040   First Provider Initiated Contact with Patient 07/14/20 1212      Chief Complaint  Patient presents with  . Contractions   Beverly Mckenzie is a 19 yo G1P0 at [redacted]w[redacted]d who presents today for abdominal cramping. She reports that these started intermittently ~2 days ago and were mild. Starting last night these became more frequent, every several hours. This morning the intensity of her cramping worsened and became more frequent, every 30 minutes. Currently she states she has 8/10 cramping discomfort in her lower abdomen every 15 minutes. These last for 3-4 minutes before subsiding to a 4/10 crampy discomfort between episodes. She also notes some yellow mucus discharge for the last few days that increased last night. Reports eating and drinking well. Has had 2 bottles of water this AM.  Denies abdominal trauma, LOF, vaginal bleeding, recent sexual intercourse, dysuria, hematuria, flank pain, diarrhea, constipation, or hematochezia.  She reports that she was recently diagnosed with a heart murmur after experiencing some chest pain, palpitations, and shortness of breath. She denies any sxs currently other than intermittent shortness of breath. Has echo on 12/2.   Past Medical History:  Diagnosis Date  . Asthma   . Depression     Past Surgical History:  Procedure Laterality Date  . NO PAST SURGERIES      Family History  Problem Relation Age of Onset  . Healthy Mother   . Healthy Father     Social History   Tobacco Use  . Smoking status: Never Smoker  . Smokeless tobacco: Never Used  Vaping Use  . Vaping Use: Never used  Substance Use Topics  . Alcohol use: No  . Drug use: No    Allergies:  Allergies  Allergen Reactions  . 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 Cat Hair Extract Hives and Swelling  . Dog Epithelium  Allergy Skin Test Hives and Swelling    Medications Prior to Admission  Medication Sig Dispense Refill Last Dose  . Iron, Ferrous Sulfate, 325 (65 Fe) MG TABS Take 1 tablet by mouth daily. 30 tablet 3 07/13/2020 at Unknown time  . Prenatal Vit-Fe Fumarate-FA (PRENATAL MULTIVITAMIN) TABS tablet Take 1 tablet by mouth daily at 12 noon.   07/13/2020 at Unknown time  . Blood Pressure Monitoring DEVI 1 Device by Does not apply route once a week. (Patient not taking: Reported on 07/04/2020) 1 Device 0   . butalbital-acetaminophen-caffeine (FIORICET) 50-325-40 MG tablet Take 1 tablet by mouth every 6 (six) hours as needed for headache. (Patient not taking: Reported on 07/04/2020) 30 tablet 0   . cyclobenzaprine (FLEXERIL) 10 MG tablet Take 1 tablet (10 mg total) by mouth every 8 (eight) hours as needed for muscle spasms. (Patient not taking: Reported on 07/04/2020) 30 tablet 1     Review of Systems  Negative unless otherwise noted in HPI. Physical Exam   Blood pressure 121/78, pulse 87, temperature 98.3 F (36.8 C), height 5\' 7"  (1.702 m), weight 78 kg, last menstrual period 11/17/2019, SpO2 100 %.  Physical Exam Constitutional:      General: She is not in acute distress.    Appearance: Normal appearance. She is not ill-appearing.  HENT:     Head: Normocephalic and atraumatic.  Cardiovascular:     Rate and Rhythm: Normal rate and regular rhythm.  Heart sounds: Murmur (3/6 holosystolic ejection murmur) heard.   Pulmonary:     Effort: Pulmonary effort is normal.     Breath sounds: Normal breath sounds.  Abdominal:     General: Bowel sounds are normal.     Palpations: Abdomen is soft.  Genitourinary:    Comments: Cervix closed and thick. Wet prep, GC/Chlamydia swabs collected. Musculoskeletal:        General: No tenderness.     Right lower leg: No edema.     Left lower leg: No edema.  Skin:    General: Skin is warm and dry.  Neurological:     General: No focal deficit present.      Mental Status: She is alert and oriented to person, place, and time.    EFM: 145 BPM, mod var, +accels, no decels Toco: Every 1.5-2 mins  MAU Course  Procedures  Results for orders placed or performed during the hospital encounter of 07/14/20 (from the past 24 hour(s))  Urinalysis, Routine w reflex microscopic Urine, Clean Catch     Status: None   Collection Time: 07/14/20 11:43 AM  Result Value Ref Range   Color, Urine YELLOW YELLOW   APPearance CLEAR CLEAR   Specific Gravity, Urine 1.009 1.005 - 1.030   pH 7.0 5.0 - 8.0   Glucose, UA NEGATIVE NEGATIVE mg/dL   Hgb urine dipstick NEGATIVE NEGATIVE   Bilirubin Urine NEGATIVE NEGATIVE   Ketones, ur NEGATIVE NEGATIVE mg/dL   Protein, ur NEGATIVE NEGATIVE mg/dL   Nitrite NEGATIVE NEGATIVE   Leukocytes,Ua NEGATIVE NEGATIVE  Wet prep, genital     Status: Abnormal   Collection Time: 07/14/20 12:35 PM   Specimen: Cervix  Result Value Ref Range   Yeast Wet Prep HPF POC NONE SEEN NONE SEEN   Trich, Wet Prep NONE SEEN NONE SEEN   Clue Cells Wet Prep HPF POC NONE SEEN NONE SEEN   WBC, Wet Prep HPF POC MODERATE (A) NONE SEEN   Sperm NONE SEEN     MDM Preterm contractions possibly exacerbated by dehydration Unlikely preterm labor given no cervical change Unlikely to be UTI/pyelo or stone given clear UA, lack of urinary sxs, flank pain, and fever. Unlikely gastritis or GI etiology with no changes in bowel habits. Wet prep, G/C Chlamydia  Assessment and Plan  Beverly Mckenzie is a 19 yo G1P0 at [redacted]w[redacted]d who presents today for abdominal cramping found to be having preterm contractions.  1. [redacted] Weeks Gestation of Pregnancy No complications thus far in her pregnancy. Heart murmur found on routine prenatal evaluation. Echo scheduled 12/2 with f/u appt on 12/8.  2. NST (non-stress test) Reactive   3. Preterm Contractions Experiencing intense cramping in her lower abdomen. Contractions noted on toco every 1.5-2 minutes. Contractions stopped  after IV bolus and terbutaline. No clear cause of contractions, possible contribution of dehydration. SG borderline 1.009 on UA without ketones. -Counseled on adequate hydration, pelvic rest, and trying to take it easy over the next several days. -s/p 1L IV LR bolus -s/p Terbutaline 0.25 mg SQ -Wet prep without signs of infection -f/u GC/Chlamydia  Dispo: Discharge Follow-up: Tomorrow 11/19 at [facility] with [place] Rx: none Return precautions discussed including LOF, vaginal bleeding, recurrent contractions, abdominal pain, or urinary sxs such as pain or hematuria.   Hart Carwin A Errico 07/14/2020, 12:13 PM     I supervised the student during this patient encounter. Please see my provider note for complete documentation.   Julianne Handler, CNM  07/14/2020 2:14 PM

## 2020-07-14 NOTE — MAU Note (Signed)
Pt reports she started feeling ctx last night only 1 every 3 hrs. Today they are more painful and about 15-20 min aprt. Good fetal movement felt. Reports a yellow mucusy discharge but no leaking or bleeding.

## 2020-07-14 NOTE — Telephone Encounter (Signed)
Called patient and she reports she is having tight squeezes in her upper abdomen. She was having them up to 3 hours apart last night and today she is having them about every 32 minutes.   She reports the intensity increases as it progresses. She says there is some cramping.  She had some discharge last night that was white, she is not sure about this morning. She has no bleeding or vaginal leaking.   Reviewed it sounds like braxton hicks contractions and that if they increase to at least every 10 minutes for more than an hours, vaginal bleeding or leaking to let us know and may need to go to hospital.   She has an appointment in the office tomorrow.

## 2020-07-14 NOTE — MAU Provider Note (Signed)
History     CSN: [id number]  Arrival date and time: 07/14/20 1040   First Provider Initiated Contact with Patient 07/14/20 1212      Chief Complaint  Patient presents with  . Contractions   19 y.o. G1 @34 .2 wks presenting with abdominal cramping. Reports onset 2 days ago. Became more frequent last night. Feeling cramps q30 min. Rates pain 4/10. Has not tried any relief measures. Denies VB or LOF but reports 2 days of yellow mucous discharge. Denies recent sex. No urinary sx. Has not eaten today but drank 2 bottles of water.    OB History    Gravida  1   Para      Term      Preterm      AB      Living        SAB      TAB      Ectopic      Multiple      Live Births              Past Medical History:  Diagnosis Date  . Asthma   . Depression     Past Surgical History:  Procedure Laterality Date  . NO PAST SURGERIES      Family History  Problem Relation Age of Onset  . Healthy Mother   . Healthy Father     Social History   Tobacco Use  . Smoking status: Never Smoker  . Smokeless tobacco: Never Used  Vaping Use  . Vaping Use: Never used  Substance Use Topics  . Alcohol use: No  . Drug use: No    Allergies:  Allergies  Allergen Reactions  . 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 Cat Hair Extract Hives and Swelling  . Dog Epithelium Allergy Skin Test Hives and Swelling    Medications Prior to Admission  Medication Sig Dispense Refill Last Dose  . Iron, Ferrous Sulfate, 325 (65 Fe) MG TABS Take 1 tablet by mouth daily. 30 tablet 3 07/13/2020 at Unknown time  . Prenatal Vit-Fe Fumarate-FA (PRENATAL MULTIVITAMIN) TABS tablet Take 1 tablet by mouth daily at 12 noon.   07/13/2020 at Unknown time  . Blood Pressure Monitoring DEVI 1 Device by Does not apply route once a week. (Patient not taking: Reported on 07/04/2020) 1 Device 0   .  butalbital-acetaminophen-caffeine (FIORICET) 50-325-40 MG tablet Take 1 tablet by mouth every 6 (six) hours as needed for headache. (Patient not taking: Reported on 07/04/2020) 30 tablet 0   . cyclobenzaprine (FLEXERIL) 10 MG tablet Take 1 tablet (10 mg total) by mouth every 8 (eight) hours as needed for muscle spasms. (Patient not taking: Reported on 07/04/2020) 30 tablet 1     Review of Systems  Gastrointestinal: Positive for abdominal pain.  Genitourinary: Positive for vaginal discharge. Negative for dysuria, frequency, hematuria, urgency and vaginal bleeding.   Physical Exam   Blood pressure 121/78, pulse 87, temperature 98.3 F (36.8 C), height 5\' 7"  (1.702 m), weight 78 kg, last menstrual period 11/17/2019, SpO2 100 %.  Physical Exam Vitals and nursing note reviewed. Exam conducted with a chaperone present.  Constitutional:      General: She is not in acute distress.    Appearance: Normal appearance.  HENT:     Head: Normocephalic and atraumatic.  Cardiovascular:     Rate and Rhythm: Normal rate.  Pulmonary:  Effort: Pulmonary effort is normal. No respiratory distress.  Abdominal:     Palpations: Abdomen is soft.     Tenderness: There is no abdominal tenderness.     Comments: gravid  Genitourinary:    Comments: VE: closed/thick Musculoskeletal:        General: Normal range of motion.     Cervical back: Normal range of motion.  Skin:    General: Skin is warm and dry.  Neurological:     General: No focal deficit present.     Mental Status: She is alert and oriented to person, place, and time.  Psychiatric:        Mood and Affect: Mood normal.        Behavior: Behavior normal.   EFM: 150 bpm, mod variability, + accels, rare variable decels Toco: q1-3, UI  Results for orders placed or performed during the hospital encounter of 07/14/20 (from the past 24 hour(s))  Urinalysis, Routine w reflex microscopic Urine, Clean Catch     Status: None   Collection Time: 07/14/20  11:43 AM  Result Value Ref Range   Color, Urine YELLOW YELLOW   APPearance CLEAR CLEAR   Specific Gravity, Urine 1.009 1.005 - 1.030   pH 7.0 5.0 - 8.0   Glucose, UA NEGATIVE NEGATIVE mg/dL   Hgb urine dipstick NEGATIVE NEGATIVE   Bilirubin Urine NEGATIVE NEGATIVE   Ketones, ur NEGATIVE NEGATIVE mg/dL   Protein, ur NEGATIVE NEGATIVE mg/dL   Nitrite NEGATIVE NEGATIVE   Leukocytes,Ua NEGATIVE NEGATIVE  Wet prep, genital     Status: Abnormal   Collection Time: 07/14/20 12:35 PM   Specimen: Cervix  Result Value Ref Range   Yeast Wet Prep HPF POC NONE SEEN NONE SEEN   Trich, Wet Prep NONE SEEN NONE SEEN   Clue Cells Wet Prep HPF POC NONE SEEN NONE SEEN   WBC, Wet Prep HPF POC MODERATE (A) NONE SEEN   Sperm NONE SEEN    MAU Course  Procedures LR Terbutaline  MDM Labs ordered and reviewed. No signs of UTI or PTL. Pt reports ctx improved after fluids and meds. Stable for discharge home.   Assessment and Plan   1. [redacted] weeks gestation of pregnancy   2. Supervision of other normal pregnancy, antepartum   3. NST (non-stress test) reactive   4. Preterm uterine contractions in third trimester, antepartum    Discharge home [facility] at [facility] tomorrow as scheduled PTL precautions Pelvic rest Maintain good hydration  Allergies as of 07/14/2020      Reactions   Justicia Adhatoda (malabar Nut Tree) [justicia Adhatoda] Anaphylaxis, Hives   PT STATE SHE IS ALLERGIC TO ALL TREE NUTS    Cat Hair Extract Hives, Swelling   Dog Epithelium Allergy Skin Test Hives, Swelling      Medication List    STOP taking these medications   butalbital-acetaminophen-caffeine 50-325-40 MG tablet Commonly known as: FIORICET   cyclobenzaprine 10 MG tablet Commonly known as: FLEXERIL     TAKE these medications   Blood Pressure Monitoring Devi 1 Device by Does not apply route once a week.   Iron (Ferrous Sulfate) 325 (65 Fe) MG Tabs Take 1 tablet by mouth daily.   prenatal multivitamin Tabs  tablet Take 1 tablet by mouth daily at 12 noon.      Julianne Handler, CNM 07/14/2020, 1:36 PM

## 2020-07-14 NOTE — Discharge Instructions (Signed)
Braxton Hicks Contractions °Contractions of the uterus can occur throughout pregnancy, but they are not always a sign that you are in labor. You may have practice contractions called Braxton Hicks contractions. These false labor contractions are sometimes confused with true labor. °What are Braxton Hicks contractions? °Braxton Hicks contractions are tightening movements that occur in the muscles of the uterus before labor. Unlike true labor contractions, these contractions do not result in opening (dilation) and thinning of the cervix. Toward the end of pregnancy (32-34 weeks), Braxton Hicks contractions can happen more often and may become stronger. These contractions are sometimes difficult to tell apart from true labor because they can be very uncomfortable. You should not feel embarrassed if you go to the hospital with false labor. °Sometimes, the only way to tell if you are in true labor is for your health care provider to look for changes in the cervix. The health care provider will do a physical exam and may monitor your contractions. If you are not in true labor, the exam should show that your cervix is not dilating and your water has not broken. °If there are no other health problems associated with your pregnancy, it is completely safe for you to be sent home with false labor. You may continue to have Braxton Hicks contractions until you go into true labor. °How to tell the difference between true labor and false labor °True labor °· Contractions last 30-70 seconds. °· Contractions become very regular. °· Discomfort is usually felt in the top of the uterus, and it spreads to the lower abdomen and low back. °· Contractions do not go away with walking. °· Contractions usually become more intense and increase in frequency. °· The cervix dilates and gets thinner. °False labor °· Contractions are usually shorter and not as strong as true labor contractions. °· Contractions are usually irregular. °· Contractions  are often felt in the front of the lower abdomen and in the groin. °· Contractions may go away when you walk around or change positions while lying down. °· Contractions get weaker and are shorter-lasting as time goes on. °· The cervix usually does not dilate or become thin. °Follow these instructions at home: ° °· Take over-the-counter and prescription medicines only as told by your health care provider. °· Keep up with your usual exercises and follow other instructions from your health care provider. °· Eat and drink lightly if you think you are going into labor. °· If Braxton Hicks contractions are making you uncomfortable: °? Change your position from lying down or resting to walking, or change from walking to resting. °? Sit and rest in a tub of warm water. °? Drink enough fluid to keep your urine pale yellow. Dehydration may cause these contractions. °? Do slow and deep breathing several times an hour. °· Keep all follow-up prenatal visits as told by your health care provider. This is important. °Contact a health care provider if: °· You have a fever. °· You have continuous pain in your abdomen. °Get help right away if: °· Your contractions become stronger, more regular, and closer together. °· You have fluid leaking or gushing from your vagina. °· You pass blood-tinged mucus (bloody show). °· You have bleeding from your vagina. °· You have low back pain that you never had before. °· You feel your baby’s head pushing down and causing pelvic pressure. °· Your baby is not moving inside you as much as it used to. °Summary °· Contractions that occur before labor are   called Braxton Hicks contractions, false labor, or practice contractions. °· Braxton Hicks contractions are usually shorter, weaker, farther apart, and less regular than true labor contractions. True labor contractions usually become progressively stronger and regular, and they become more frequent. °· Manage discomfort from Braxton Hicks contractions  by changing position, resting in a warm bath, drinking plenty of water, or practicing deep breathing. °This information is not intended to replace advice given to you by your health care provider. Make sure you discuss any questions you have with your health care provider. °Document Revised: 07/26/2017 Document Reviewed: 12/27/2016 °Elsevier Patient Education © 2020 Elsevier Inc. ° °

## 2020-07-15 ENCOUNTER — Ambulatory Visit (INDEPENDENT_AMBULATORY_CARE_PROVIDER_SITE_OTHER): Payer: Medicaid Other | Admitting: Medical

## 2020-07-15 VITALS — BP 113/80 | HR 101 | Wt 172.6 lb

## 2020-07-15 DIAGNOSIS — Z3480 Encounter for supervision of other normal pregnancy, unspecified trimester: Secondary | ICD-10-CM

## 2020-07-15 DIAGNOSIS — Z148 Genetic carrier of other disease: Secondary | ICD-10-CM

## 2020-07-15 DIAGNOSIS — D259 Leiomyoma of uterus, unspecified: Secondary | ICD-10-CM

## 2020-07-15 DIAGNOSIS — Z3A34 34 weeks gestation of pregnancy: Secondary | ICD-10-CM

## 2020-07-15 LAB — GC/CHLAMYDIA PROBE AMP (~~LOC~~) NOT AT ARMC
Chlamydia: NEGATIVE
Comment: NEGATIVE
Comment: NORMAL
Neisseria Gonorrhea: NEGATIVE

## 2020-07-15 NOTE — Progress Notes (Signed)
   PRENATAL VISIT NOTE  Subjective:  Beverly Mckenzie is a 19 y.o. G1P0 at [redacted]w[redacted]d being seen today for ongoing prenatal care.  She is currently monitored for the following issues for this high-risk pregnancy and has Supervision of other normal pregnancy, antepartum; Fibroid, uterine; Asthma; and Genetic carrier on their problem list.  Patient reports occasional contractions.  Contractions: Irritability. Vag. Bleeding: None.  Movement: Present. Denies leaking of fluid.   The following portions of the patient's history were reviewed and updated as appropriate: allergies, current medications, past family history, past medical history, past social history, past surgical history and problem list.   Objective:   Vitals:   07/15/20 1120  BP: 113/80  Pulse: (!) 101  Weight: 172 lb 9.6 oz (78.3 kg)    Fetal Status: Fetal Heart Rate (bpm): 143 Fundal Height: 36 cm Movement: Present     General:  Alert, oriented and cooperative. Patient is in no acute distress.  Skin: Skin is warm and dry. No rash noted.   Cardiovascular: Normal heart rate noted  Respiratory: Normal respiratory effort, no problems with respiration noted  Abdomen: Soft, gravid, appropriate for gestational age.  Pain/Pressure: Present     Pelvic: Cervical exam deferred        Extremities: Normal range of motion.  Edema: Trace  Mental Status: Normal mood and affect. Normal behavior. Normal judgment and thought content.   Assessment and Plan:  Pregnancy: G1P0 at 101w3d 1. Supervision of other normal pregnancy, antepartum - Doing well - Went to MAU yesterday for contractions, resolved with IV fluids and Terbutaline - Still having some contractions, but less than yesterday  - Anticipatory guidance for next visit and need for GBS at that time discussed  -- Cardiac echo scheduled for Jun 17, 2001. 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 Uterine leiomyoma, unspecified location  4. [redacted] weeks gestation of  pregnancy  Preterm labor symptoms and general obstetric precautions including but not limited to vaginal bleeding, contractions, leaking of fluid and fetal movement were reviewed in detail with the patient. Please refer to After Visit Summary for other counseling recommendations.   Return in about 2 weeks (around 07/29/2020) for LOB, In-Person, APP preferred.  Future Appointments  Date Time Provider [place]  07/28/2020  9:15 AM [id number]  07/29/2020  9:55 AM Megan Salon, MD [facility]  08/03/2020  8:20 AM Elouise Munroe, MD [facility]    Kerry Hough, PA-C

## 2020-07-15 NOTE — Patient Instructions (Signed)
Braxton Hicks Contractions Contractions of the uterus can occur throughout pregnancy, but they are not always a sign that you are in labor. You may have practice contractions called Braxton Hicks contractions. These false labor contractions are sometimes confused with true labor. What are Braxton Hicks contractions? Braxton Hicks contractions are tightening movements that occur in the muscles of the uterus before labor. Unlike true labor contractions, these contractions do not result in opening (dilation) and thinning of the cervix. Toward the end of pregnancy (32-34 weeks), Braxton Hicks contractions can happen more often and may become stronger. These contractions are sometimes difficult to tell apart from true labor because they can be very uncomfortable. You should not feel embarrassed if you go to the hospital with false labor. Sometimes, the only way to tell if you are in true labor is for your health care provider to look for changes in the cervix. The health care provider will do a physical exam and may monitor your contractions. If you are not in true labor, the exam should show that your cervix is not dilating and your water has not broken. If there are no other health problems associated with your pregnancy, it is completely safe for you to be sent home with false labor. You may continue to have Braxton Hicks contractions until you go into true labor. How to tell the difference between true labor and false labor True labor  Contractions last 30-70 seconds.  Contractions become very regular.  Discomfort is usually felt in the top of the uterus, and it spreads to the lower abdomen and low back.  Contractions do not go away with walking.  Contractions usually become more intense and increase in frequency.  The cervix dilates and gets thinner. False labor  Contractions are usually shorter and not as strong as true labor contractions.  Contractions are usually irregular.  Contractions  are often felt in the front of the lower abdomen and in the groin.  Contractions may go away when you walk around or change positions while lying down.  Contractions get weaker and are shorter-lasting as time goes on.  The cervix usually does not dilate or become thin. Follow these instructions at home:   Take over-the-counter and prescription medicines only as told by your health care provider.  Keep up with your usual exercises and follow other instructions from your health care provider.  Eat and drink lightly if you think you are going into labor.  If Braxton Hicks contractions are making you uncomfortable: ? Change your position from lying down or resting to walking, or change from walking to resting. ? Sit and rest in a tub of warm water. ? Drink enough fluid to keep your urine pale yellow. Dehydration may cause these contractions. ? Do slow and deep breathing several times an hour.  Keep all follow-up prenatal visits as told by your health care provider. This is important. Contact a health care provider if:  You have a fever.  You have continuous pain in your abdomen. Get help right away if:  Your contractions become stronger, more regular, and closer together.  You have fluid leaking or gushing from your vagina.  You pass blood-tinged mucus (bloody show).  You have bleeding from your vagina.  You have low back pain that you never had before.  You feel your baby's head pushing down and causing pelvic pressure.  Your baby is not moving inside you as much as it used to. Summary  Contractions that occur before labor are   called Braxton Hicks contractions, false labor, or practice contractions.  Braxton Hicks contractions are usually shorter, weaker, farther apart, and less regular than true labor contractions. True labor contractions usually become progressively stronger and regular, and they become more frequent.  Manage discomfort from [facility] contractions  by changing position, resting in a warm bath, drinking plenty of water, or practicing deep breathing. This information is not intended to replace advice given to you by your health care provider. Make sure you discuss any questions you have with your health care provider. Document Revised: 07/26/2017 Document Reviewed: 12/27/2016 Elsevier Patient Education  [employer]. Fetal Movement Counts Patient Name: ________________________________________________ Patient Due Date: ____________________ What is a fetal movement count?  A fetal movement count is the number of times that you feel your baby move during a certain amount of time. This may also be called a fetal kick count. A fetal movement count is recommended for every pregnant woman. You may be asked to start counting fetal movements as early as week 28 of your pregnancy. Pay attention to when your baby is most active. You may notice your baby's sleep and wake cycles. You may also notice things that make your baby move more. You should do a fetal movement count:  When your baby is normally most active.  At the same time each day. A good time to count movements is while you are resting, after having something to eat and drink. How do I count fetal movements? 1. Find a quiet, comfortable area. Sit, or lie down on your side. 2. Write down the date, the start time and stop time, and the number of movements that you felt between those two times. Take this information with you to your health care visits. 3. Write down your start time when you feel the first movement. 4. Count kicks, flutters, swishes, rolls, and jabs. You should feel at least 10 movements. 5. You may stop counting after you have felt 10 movements, or if you have been counting for 2 hours. Write down the stop time. 6. If you do not feel 10 movements in 2 hours, contact your health care provider for further instructions. Your health care provider may want to do additional tests  to assess your baby's well-being. Contact a health care provider if:  You feel fewer than 10 movements in 2 hours.  Your baby is not moving like he or she usually does. Date: ____________ Start time: ____________ Stop time: ____________ Movements: ____________ Date: ____________ Start time: ____________ Stop time: ____________ Movements: ____________ Date: ____________ Start time: ____________ Stop time: ____________ Movements: ____________ Date: ____________ Start time: ____________ Stop time: ____________ Movements: ____________ Date: ____________ Start time: ____________ Stop time: ____________ Movements: ____________ Date: ____________ Start time: ____________ Stop time: ____________ Movements: ____________ Date: ____________ Start time: ____________ Stop time: ____________ Movements: ____________ Date: ____________ Start time: ____________ Stop time: ____________ Movements: ____________ Date: ____________ Start time: ____________ Stop time: ____________ Movements: ____________ This information is not intended to replace advice given to you by your health care provider. Make sure you discuss any questions you have with your health care provider. Document Revised: 04/02/2019 Document Reviewed: 04/02/2019 Elsevier Patient Education  [place].

## 2020-07-28 ENCOUNTER — Ambulatory Visit (HOSPITAL_COMMUNITY): Payer: Medicaid Other | Attending: Cardiovascular Disease

## 2020-07-28 ENCOUNTER — Other Ambulatory Visit: Payer: Self-pay

## 2020-07-28 ENCOUNTER — Encounter: Payer: Medicaid Other | Admitting: Nurse Practitioner

## 2020-07-28 DIAGNOSIS — Z3A32 32 weeks gestation of pregnancy: Secondary | ICD-10-CM | POA: Diagnosis not present

## 2020-07-28 DIAGNOSIS — R0602 Shortness of breath: Secondary | ICD-10-CM | POA: Diagnosis not present

## 2020-07-28 DIAGNOSIS — J4520 Mild intermittent asthma, uncomplicated: Secondary | ICD-10-CM | POA: Insufficient documentation

## 2020-07-28 DIAGNOSIS — O99513 Diseases of the respiratory system complicating pregnancy, third trimester: Secondary | ICD-10-CM | POA: Insufficient documentation

## 2020-07-28 LAB — ECHOCARDIOGRAM COMPLETE
Area-P 1/2: 6.71 cm2
S' Lateral: 3.4 cm

## 2020-07-29 ENCOUNTER — Encounter: Payer: Self-pay | Admitting: Obstetrics & Gynecology

## 2020-07-29 ENCOUNTER — Ambulatory Visit (INDEPENDENT_AMBULATORY_CARE_PROVIDER_SITE_OTHER): Payer: Medicaid Other | Admitting: Obstetrics & Gynecology

## 2020-07-29 VITALS — BP 121/86 | HR 101 | Wt 177.9 lb

## 2020-07-29 DIAGNOSIS — Z3A36 36 weeks gestation of pregnancy: Secondary | ICD-10-CM

## 2020-07-29 DIAGNOSIS — D259 Leiomyoma of uterus, unspecified: Secondary | ICD-10-CM

## 2020-07-29 DIAGNOSIS — R9431 Abnormal electrocardiogram [ECG] [EKG]: Secondary | ICD-10-CM

## 2020-07-29 DIAGNOSIS — Z148 Genetic carrier of other disease: Secondary | ICD-10-CM

## 2020-07-29 NOTE — Progress Notes (Signed)
   PRENATAL VISIT NOTE  Subjective:  Beverly Mckenzie is a 19 y.o. G1P0 at [redacted]w[redacted]d being seen today for ongoing prenatal care.  She is currently monitored for the following issues for this high-risk pregnancy and has Supervision of other normal pregnancy, antepartum; Fibroid, uterine; Asthma; and Genetic carrier on their problem list.  Patient reports no complaints.  Contractions: Irritability. Vag. Bleeding: None.  Movement: Present. Denies leaking of fluid.   The following portions of the patient's history were reviewed and updated as appropriate: allergies, current medications, past family history, past medical history, past social history, past surgical history and problem list.   Objective:   Vitals:   07/29/20 1018  BP: 121/86  Pulse: (!) 101  Weight: 177 lb 14.4 oz (80.7 kg)    Fetal Status: Fetal Heart Rate (bpm): 144   Movement: Present     General:  Alert, oriented and cooperative. Patient is in no acute distress.  Skin: Skin is warm and dry. No rash noted.   Cardiovascular: Normal heart rate noted  Respiratory: Normal respiratory effort, no problems with respiration noted  Abdomen: Soft, gravid, appropriate for gestational age.  Pain/Pressure: Absent     Pelvic: Cervical exam deferred        Extremities: Normal range of motion.  Edema: None  Mental Status: Normal mood and affect. Normal behavior. Normal judgment and thought content.   Assessment and Plan:  Pregnancy: G1P0 at [redacted]w[redacted]d 1. [redacted] weeks gestation of pregnancy -GBS obtained today -Follow up 1 week.  Pt wants to come in person. -on PNV  2. 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  Has spoken with Johnsie Cancel genetic counselor -Carries two gene mutations for SMA.  FOB is in the [employer] and is aware about need for testing.  He will hopefully be here for delivery.  3. Uterine leiomyoma, unspecified location  4.  Non specific EKG changes Cardiology follow up next week is  scheduled  5.  Failed Nexplanon with this pregnancy -We discussed other options today.  She is planning to use Nexplanon again but does have some worry about  Preterm labor symptoms and general obstetric precautions including but not limited to vaginal bleeding, contractions, leaking of fluid and fetal movement were reviewed in detail with the patient. Please refer to After Visit Summary for other counseling recommendations.   Return in about 1 week (around 08/05/2020) for Office ob visit (MD or APP).  Future Appointments  Date Time Provider [place]  08/03/2020  8:20 AM Elouise Munroe, MD [facility]    Megan Salon, MD

## 2020-07-31 LAB — STREP GP B NAA: Strep Gp B NAA: NEGATIVE

## 2020-08-03 ENCOUNTER — Telehealth: Payer: Medicaid Other | Admitting: Internal Medicine

## 2020-08-03 ENCOUNTER — Telehealth: Payer: Self-pay | Admitting: Lactation Services

## 2020-08-03 ENCOUNTER — Inpatient Hospital Stay (HOSPITAL_COMMUNITY)
Admission: AD | Admit: 2020-08-03 | Discharge: 2020-08-03 | Disposition: A | Discharge status: Home / Self Care | Payer: Medicaid Other | Attending: Obstetrics and Gynecology | Admitting: Obstetrics and Gynecology

## 2020-08-03 ENCOUNTER — Encounter (HOSPITAL_COMMUNITY): Payer: Self-pay | Admitting: Obstetrics and Gynecology

## 2020-08-03 ENCOUNTER — Other Ambulatory Visit: Payer: Self-pay

## 2020-08-03 DIAGNOSIS — O471 False labor at or after 37 completed weeks of gestation: Secondary | ICD-10-CM

## 2020-08-03 DIAGNOSIS — Z3A37 37 weeks gestation of pregnancy: Secondary | ICD-10-CM | POA: Insufficient documentation

## 2020-08-03 LAB — AMNISURE RUPTURE OF MEMBRANE (ROM) NOT AT ARMC: Amnisure ROM: NEGATIVE

## 2020-08-03 LAB — POCT FERN TEST: POCT Fern Test: NEGATIVE

## 2020-08-03 NOTE — MAU Note (Signed)
I have communicated with Sunday Corn, CNM and reviewed vital signs:  Vitals:   08/03/20 0904 08/03/20 1023  BP: 120/80 123/81  Pulse: 81 88  Resp: 18 19  Temp: 98 F (36.7 C) 98.3 F (36.8 C)  SpO2: 100% 100%    Vaginal exam:  Dilation: Closed Effacement (%): Thick Cervical Position: Posterior Exam by:: F. Tonimarie Gritz, RNC,   Also reviewed contraction pattern and that non-stress test is reactive.  It has been documented that patient is contracting irregularly with a closed cervix, not indicating active labor.  Patient denies any other complaints.  Based on this report provider has given order for discharge.  A discharge order and diagnosis entered by a provider.   Labor discharge instructions reviewed with patient.

## 2020-08-03 NOTE — MAU Note (Signed)
CTX q 15 mins.  Patient states she's had some fluid leaking for the past 2 days but isn't sure if it's her water.  Denies VB.  Endorses + FM.  Denies complications w/ her pregnancy. Last CE at 35 wks-closed.

## 2020-08-03 NOTE — MAU Provider Note (Signed)
S: Beverly Mckenzie is a 19 y.o. G1P0 at [redacted]w[redacted]d  who presents to MAU today complaining of leaking of fluid since 08/01/2020. She denies vaginal bleeding. She endorses contractions. She reports normal fetal movement.    O: BP 123/81 (BP Location: Right Arm)   Pulse 88   Temp 98.3 F (36.8 C) (Oral)   Resp 19   Wt 82.6 kg   LMP 11/17/2019   SpO2 100%   BMI 28.51 kg/m  GENERAL: Well-developed, well-nourished female in no acute distress.  HEAD: Normocephalic, atraumatic.  CHEST: Normal effort of breathing, regular heart rate ABDOMEN: Soft, nontender, gravid PELVIC: Normal external female genitalia. Vagina is pink and rugated. Cervix with normal contour, no lesions. Normal discharge.  Amnisure collected by blind swab by Roderic Scarce, RN -- dry perineum by RN report.  Cervical exam:  Dilation: Closed Effacement (%): Thick Cervical Position: Posterior Exam by:: Truitt Leep, RNC   Fetal Monitoring: Baseline: 145 Variability: moderate Accelerations: present Decelerations: absent Contractions: irregular every 8-15 minutes  Results for orders placed or performed during the hospital encounter of 08/03/20 (from the past 24 hour(s))  POCT fern test     Status: None   Collection Time: 08/03/20  9:41 AM  Result Value Ref Range   POCT Fern Test Negative = intact amniotic membranes   Amnisure rupture of membrane (rom)not at [facility]     Status: None   Collection Time: 08/03/20  9:42 AM  Result Value Ref Range   Amnisure ROM NEGATIVE     A: [facility] at [redacted]w[redacted]d  Membranes intact False labor at or after 37 completed weeks of gestation - Plan: Discharge patient  P: Discharge home Keep scheduled appointment with [place] on 08/05/2020 Patient verbalized an understanding of the plan of care and agrees.   Laury Deep, CNM 08/03/2020, 10:05 AM

## 2020-08-03 NOTE — Telephone Encounter (Signed)
Patient called and in reports she was having contractions all through the night. She wert to MAU and was sent home with false labor. She felt she was leaking at the time.   Patient reports she is having some bleeding with wiping with small 1 clot and a small amount in her underwear. Reviewed this can happen with cervical exams and is bleeding becomes heavier, she is to go back to MAU.   Patient reports her contractions are about 10 minutes apart and that the contractions are painful at a 8/10. She reports she is staying hydrated and walking and reports the contractions are not changing. This is similar to what was happening in the MAU earlier.   Reviewed with patient that if in severe pain, bleeding like a period, contractions of more that 6 contractions in an hour for more than 2 hours that does not change with walking or hydrating.   Patient with concerns about visit to MAU, she would like to speak with Maye Hides, CNM, will route message to her.

## 2020-08-04 ENCOUNTER — Telehealth: Payer: Self-pay | Admitting: Student

## 2020-08-04 NOTE — Telephone Encounter (Signed)
Called patient to discuss her concerns; no answer and no vmail. Will send mychart message.

## 2020-08-05 ENCOUNTER — Ambulatory Visit (INDEPENDENT_AMBULATORY_CARE_PROVIDER_SITE_OTHER): Payer: Medicaid Other | Admitting: Family Medicine

## 2020-08-05 ENCOUNTER — Other Ambulatory Visit: Payer: Self-pay

## 2020-08-05 ENCOUNTER — Encounter (HOSPITAL_COMMUNITY): Payer: Self-pay | Admitting: Family Medicine

## 2020-08-05 ENCOUNTER — Inpatient Hospital Stay (HOSPITAL_COMMUNITY)
Admission: AD | Admit: 2020-08-05 | Discharge: 2020-08-05 | Disposition: A | Discharge status: Home / Self Care | Payer: Medicaid Other | Attending: Family Medicine | Admitting: Family Medicine

## 2020-08-05 ENCOUNTER — Encounter: Payer: Self-pay | Admitting: Family Medicine

## 2020-08-05 VITALS — BP 134/82 | HR 84 | Wt 178.6 lb

## 2020-08-05 DIAGNOSIS — Z148 Genetic carrier of other disease: Secondary | ICD-10-CM

## 2020-08-05 DIAGNOSIS — Z3480 Encounter for supervision of other normal pregnancy, unspecified trimester: Secondary | ICD-10-CM

## 2020-08-05 DIAGNOSIS — O471 False labor at or after 37 completed weeks of gestation: Secondary | ICD-10-CM | POA: Diagnosis not present

## 2020-08-05 DIAGNOSIS — Z3A37 37 weeks gestation of pregnancy: Secondary | ICD-10-CM | POA: Diagnosis not present

## 2020-08-05 DIAGNOSIS — D259 Leiomyoma of uterus, unspecified: Secondary | ICD-10-CM

## 2020-08-05 MED ORDER — ACETAMINOPHEN 500 MG PO TABS
1000.0000 mg | ORAL_TABLET | Freq: Four times a day (QID) | ORAL | Status: DC | PRN
  Administered 2020-08-05: 1000 mg via ORAL
  Filled 2020-08-05: qty 2

## 2020-08-05 NOTE — MAU Note (Signed)
I have communicated with Nigel Berthold, CNM and reviewed vital signs:  Vitals:   08/05/20 0333 08/05/20 0549  BP: 121/86 116/77  Pulse: 88 87  Resp: 17   Temp: 98.5 F (36.9 C)   SpO2: 100% 100%    Vaginal exam:  Dilation: Fingertip Effacement (%): Thick Station: Ballotable Presentation: Vertex Exam by:: Cloretta Ned, RN,   Also reviewed contraction pattern and that non-stress test is reactive.  It has been documented that patient is contracting irregularly with occasional uterine irritability with no cervical change over 1 hour 20 minutes not indicating active labor.  Patient denies any other complaints.  Based on this report provider has given order for discharge.  A discharge order and diagnosis entered by a provider.   Labor discharge instructions reviewed with patient.

## 2020-08-05 NOTE — MAU Provider Note (Signed)
Per RN:  Beverly Mckenzie is a 19 y.o. at [redacted]w[redacted]d here in MAU reporting: CTX that have been ongoing since yesterday. Was seen in MAU yesterday for CTX, sent home with labor precautions. Reports CTX every 5 minutes. Reports vaginal spotting with mucous. +FM  cx FT/long no change in 2 hours, mild and irregular ctx.  NST: FHR baseline 140 bpm, Variability: moderate, Accelerations:present, Decelerations:  Absent= Cat 1/Reactive

## 2020-08-05 NOTE — Patient Instructions (Signed)
Contraception Choices Contraception, also called birth control, refers to methods or devices that prevent pregnancy. Hormonal methods Contraceptive implant  A contraceptive implant is a thin, plastic tube that contains a hormone. It is inserted into the upper part of the arm. It can remain in place for up to 3 years. Progestin-only injections Progestin-only injections are injections of progestin, a synthetic form of the hormone progesterone. They are given every 3 months by a health care provider. Birth control pills  Birth control pills are pills that contain hormones that prevent pregnancy. They must be taken once a day, preferably at the same time each day. Birth control patch  The birth control patch contains hormones that prevent pregnancy. It is placed on the skin and must be changed once a week for three weeks and removed on the fourth week. A prescription is needed to use this method of contraception. Vaginal ring  A vaginal ring contains hormones that prevent pregnancy. It is placed in the vagina for three weeks and removed on the fourth week. After that, the process is repeated with a new ring. A prescription is needed to use this method of contraception. Emergency contraceptive Emergency contraceptives prevent pregnancy after unprotected sex. They come in pill form and can be taken up to 5 days after sex. They work best the sooner they are taken after having sex. Most emergency contraceptives are available without a prescription. This method should not be used as your only form of birth control. Barrier methods Female condom  A female condom is a thin sheath that is worn over the penis during sex. Condoms keep sperm from going inside a woman's body. They can be used with a spermicide to increase their effectiveness. They should be disposed after a single use. Female condom  A female condom is a soft, loose-fitting sheath that is put into the vagina before sex. The condom keeps  sperm from going inside a woman's body. They should be disposed after a single use. Diaphragm  A diaphragm is a soft, dome-shaped barrier. It is inserted into the vagina before sex, along with a spermicide. The diaphragm blocks sperm from entering the uterus, and the spermicide kills sperm. A diaphragm should be left in the vagina for 6-8 hours after sex and removed within 24 hours. A diaphragm is prescribed and fitted by a health care provider. A diaphragm should be replaced every 1-2 years, after giving birth, after gaining more than 15 lb (6.8 kg), and after pelvic surgery. Cervical cap  A cervical cap is a round, soft latex or plastic cup that fits over the cervix. It is inserted into the vagina before sex, along with spermicide. It blocks sperm from entering the uterus. The cap should be left in place for 6-8 hours after sex and removed within 48 hours. A cervical cap must be prescribed and fitted by a health care provider. It should be replaced every 2 years. Sponge  A sponge is a soft, circular piece of polyurethane foam with spermicide on it. The sponge helps block sperm from entering the uterus, and the spermicide kills sperm. To use it, you make it wet and then insert it into the vagina. It should be inserted before sex, left in for at least 6 hours after sex, and removed and thrown away within 30 hours. Spermicides Spermicides are chemicals that kill or block sperm from entering the cervix and uterus. They can come as a cream, jelly, suppository, foam, or tablet. A spermicide should be inserted into  the vagina with an applicator at least 81-19 minutes before sex to allow time for it to work. The process must be repeated every time you have sex. Spermicides do not require a prescription. Intrauterine contraception Intrauterine device (IUD) An IUD is a T-shaped device that is put in a woman's uterus. There are two types:  Hormone IUD.This type contains progestin, a synthetic form of the  hormone progesterone. This type can stay in place for 3-5 years.  Copper IUD.This type is wrapped in copper wire. It can stay in place for 10 years.  Permanent methods of contraception Female tubal ligation In this method, a woman's fallopian tubes are sealed, tied, or blocked during surgery to prevent eggs from traveling to the uterus. Hysteroscopic sterilization In this method, a small, flexible insert is placed into each fallopian tube. The inserts cause scar tissue to form in the fallopian tubes and block them, so sperm cannot reach an egg. The procedure takes about 3 months to be effective. Another form of birth control must be used during those 3 months. Female sterilization This is a procedure to tie off the tubes that carry sperm (vasectomy). After the procedure, the man can still ejaculate fluid (semen). Natural planning methods Natural family planning In this method, a couple does not have sex on days when the woman could become pregnant. Calendar method This means keeping track of the length of each menstrual cycle, identifying the days when pregnancy can happen, and not having sex on those days. Ovulation method In this method, a couple avoids sex during ovulation. Symptothermal method This method involves not having sex during ovulation. The woman typically checks for ovulation by watching changes in her temperature and in the consistency of cervical mucus. Post-ovulation method In this method, a couple waits to have sex until after ovulation. Summary  Contraception, also called birth control, means methods or devices that prevent pregnancy.  Hormonal methods of contraception include implants, injections, pills, patches, vaginal rings, and emergency contraceptives.  Barrier methods of contraception can include female condoms, female condoms, diaphragms, cervical caps, sponges, and spermicides.  There are two types of IUDs (intrauterine devices). An IUD can be put in a woman's  uterus to prevent pregnancy for 3-5 years.  Permanent sterilization can be done through a procedure for males, females, or both.  Natural family planning methods involve not having sex on days when the woman could become pregnant. This information is not intended to replace advice given to you by your health care provider. Make sure you discuss any questions you have with your health care provider. Document Revised: 08/15/2017 Document Reviewed: 09/15/2016 Elsevier Patient Education  2020 [employer].   Breastfeeding  Choosing to breastfeed is one of the best decisions you can make for yourself and your baby. A change in hormones during pregnancy causes your breasts to make breast milk in your milk-producing glands. Hormones prevent breast milk from being released before your baby is born. They also prompt milk flow after birth. Once breastfeeding has begun, thoughts of your baby, as well as his or her sucking or crying, can stimulate the release of milk from your milk-producing glands. Benefits of breastfeeding Research shows that breastfeeding offers many health benefits for infants and mothers. It also offers a cost-free and convenient way to feed your baby. For your baby  Your first milk (colostrum) helps your baby's digestive system to function better.  Special cells in your milk (antibodies) help your baby to fight off infections.  Breastfed babies are  less likely to develop asthma, allergies, obesity, or type 2 diabetes. They are also at lower risk for sudden infant death syndrome (SIDS).  Nutrients in breast milk are better able to meet your baby's needs compared to infant formula.  Breast milk improves your baby's brain development. For you  Breastfeeding helps to create a very special bond between you and your baby.  Breastfeeding is convenient. Breast milk costs nothing and is always available at the correct temperature.  Breastfeeding helps to burn calories. It helps you  to lose the weight that you gained during pregnancy.  Breastfeeding makes your uterus return faster to its size before pregnancy. It also slows bleeding (lochia) after you give birth.  Breastfeeding helps to lower your risk of developing type 2 diabetes, osteoporosis, rheumatoid arthritis, cardiovascular disease, and breast, ovarian, uterine, and endometrial cancer later in life. Breastfeeding basics Starting breastfeeding  Find a comfortable place to sit or lie down, with your neck and back well-supported.  Place a pillow or a rolled-up blanket under your baby to bring him or her to the level of your breast (if you are seated). Nursing pillows are specially designed to help support your arms and your baby while you breastfeed.  Make sure that your baby's tummy (abdomen) is facing your abdomen.  Gently massage your breast. With your fingertips, massage from the outer edges of your breast inward toward the nipple. This encourages milk flow. If your milk flows slowly, you may need to continue this action during the feeding.  Support your breast with 4 fingers underneath and your thumb above your nipple (make the letter "C" with your hand). Make sure your fingers are well away from your nipple and your baby's mouth.  Stroke your baby's lips gently with your finger or nipple.  When your baby's mouth is open wide enough, quickly bring your baby to your breast, placing your entire nipple and as much of the areola as possible into your baby's mouth. The areola is the colored area around your nipple. ? More areola should be visible above your baby's upper lip than below the lower lip. ? Your baby's lips should be opened and extended outward (flanged) to ensure an adequate, comfortable latch. ? Your baby's tongue should be between his or her lower gum and your breast.  Make sure that your baby's mouth is correctly positioned around your nipple (latched). Your baby's lips should create a seal on your  breast and be turned out (everted).  It is common for your baby to suck about 2-3 minutes in order to start the flow of breast milk. Latching Teaching your baby how to latch onto your breast properly is very important. An improper latch can cause nipple pain, decreased milk supply, and poor weight gain in your baby. Also, if your baby is not latched onto your nipple properly, he or she may swallow some air during feeding. This can make your baby fussy. Burping your baby when you switch breasts during the feeding can help to get rid of the air. However, teaching your baby to latch on properly is still the best way to prevent fussiness from swallowing air while breastfeeding. Signs that your baby has successfully latched onto your nipple  Silent tugging or silent sucking, without causing you pain. Infant's lips should be extended outward (flanged).  Swallowing heard between every 3-4 sucks once your milk has started to flow (after your let-down milk reflex occurs).  Muscle movement above and in front of his or her  ears while sucking. Signs that your baby has not successfully latched onto your nipple  Sucking sounds or smacking sounds from your baby while breastfeeding.  Nipple pain. If you think your baby has not latched on correctly, slip your finger into the corner of your baby's mouth to break the suction and place it between your baby's gums. Attempt to start breastfeeding again. Signs of successful breastfeeding Signs from your baby  Your baby will gradually decrease the number of sucks or will completely stop sucking.  Your baby will fall asleep.  Your baby's body will relax.  Your baby will retain a small amount of milk in his or her mouth.  Your baby will let go of your breast by himself or herself. Signs from you  Breasts that have increased in firmness, weight, and size 1-3 hours after feeding.  Breasts that are softer immediately after breastfeeding.  Increased milk  volume, as well as a change in milk consistency and color by the fifth day of breastfeeding.  Nipples that are not sore, cracked, or bleeding. Signs that your baby is getting enough milk  Wetting at least 1-2 diapers during the first 24 hours after birth.  Wetting at least 5-6 diapers every 24 hours for the first week after birth. The urine should be clear or pale yellow by the age of 5 days.  Wetting 6-8 diapers every 24 hours as your baby continues to grow and develop.  At least 3 stools in a 24-hour period by the age of 5 days. The stool should be soft and yellow.  At least 3 stools in a 24-hour period by the age of 7 days. The stool should be seedy and yellow.  No loss of weight greater than 10% of birth weight during the first 3 days of life.  Average weight gain of 4-7 oz (113-198 g) per week after the age of 4 days.  Consistent daily weight gain by the age of 5 days, without weight loss after the age of 2 weeks. After a feeding, your baby may spit up a small amount of milk. This is normal. Breastfeeding frequency and duration Frequent feeding will help you make more milk and can prevent sore nipples and extremely full breasts (breast engorgement). Breastfeed when you feel the need to reduce the fullness of your breasts or when your baby shows signs of hunger. This is called "breastfeeding on demand." Signs that your baby is hungry include:  Increased alertness, activity, or restlessness.  Movement of the head from side to side.  Opening of the mouth when the corner of the mouth or cheek is stroked (rooting).  Increased sucking sounds, smacking lips, cooing, sighing, or squeaking.  Hand-to-mouth movements and sucking on fingers or hands.  Fussing or crying. Avoid introducing a pacifier to your baby in the first 4-6 weeks after your baby is born. After this time, you may choose to use a pacifier. Research has shown that pacifier use during the first year of a baby's life  decreases the risk of sudden infant death syndrome (SIDS). Allow your baby to feed on each breast as long as he or she wants. When your baby unlatches or falls asleep while feeding from the first breast, offer the second breast. Because newborns are often sleepy in the first few weeks of life, you may need to awaken your baby to get him or her to feed. Breastfeeding times will vary from baby to baby. However, the following rules can serve as a guide to  help you make sure that your baby is properly fed:  Newborns (babies 93 weeks of age or younger) may breastfeed every 1-3 hours.  Newborns should not go without breastfeeding for longer than 3 hours during the day or 5 hours during the night.  You should breastfeed your baby a minimum of 8 times in a 24-hour period. Breast milk pumping     Pumping and storing breast milk allows you to make sure that your baby is exclusively fed your breast milk, even at times when you are unable to breastfeed. This is especially important if you go back to work while you are still breastfeeding, or if you are not able to be present during feedings. Your lactation consultant can help you find a method of pumping that works best for you and give you guidelines about how long it is safe to store breast milk. Caring for your breasts while you breastfeed Nipples can become dry, cracked, and sore while breastfeeding. The following recommendations can help keep your breasts moisturized and healthy:  Avoid using soap on your nipples.  Wear a supportive bra designed especially for nursing. Avoid wearing underwire-style bras or extremely tight bras (sports bras).  Air-dry your nipples for 3-4 minutes after each feeding.  Use only cotton bra pads to absorb leaked breast milk. Leaking of breast milk between feedings is normal.  Use lanolin on your nipples after breastfeeding. Lanolin helps to maintain your skin's normal moisture barrier. Pure lanolin is not harmful (not  toxic) to your baby. You may also hand express a few drops of breast milk and gently massage that milk into your nipples and allow the milk to air-dry. In the first few weeks after giving birth, some women experience breast engorgement. Engorgement can make your breasts feel heavy, warm, and tender to the touch. Engorgement peaks within 3-5 days after you give birth. The following recommendations can help to ease engorgement:  Completely empty your breasts while breastfeeding or pumping. You may want to start by applying warm, moist heat (in the shower or with warm, water-soaked hand towels) just before feeding or pumping. This increases circulation and helps the milk flow. If your baby does not completely empty your breasts while breastfeeding, pump any extra milk after he or she is finished.  Apply ice packs to your breasts immediately after breastfeeding or pumping, unless this is too uncomfortable for you. To do this: ? Put ice in a plastic bag. ? Place a towel between your skin and the bag. ? Leave the ice on for 20 minutes, 2-3 times a day.  Make sure that your baby is latched on and positioned properly while breastfeeding. If engorgement persists after 48 hours of following these recommendations, contact your health care provider or a Science writer. Overall health care recommendations while breastfeeding  Eat 3 healthy meals and 3 snacks every day. Well-nourished mothers who are breastfeeding need an additional 450-500 calories a day. You can meet this requirement by increasing the amount of a balanced diet that you eat.  Drink enough water to keep your urine pale yellow or clear.  Rest often, relax, and continue to take your prenatal vitamins to prevent fatigue, stress, and low vitamin and mineral levels in your body (nutrient deficiencies).  Do not use any products that contain nicotine or tobacco, such as cigarettes and e-cigarettes. Your baby may be harmed by chemicals from  cigarettes that pass into breast milk and exposure to secondhand smoke. If you need help quitting, ask your  health care provider.  Avoid alcohol.  Do not use illegal drugs or marijuana.  Talk with your health care provider before taking any medicines. These include over-the-counter and prescription medicines as well as vitamins and herbal supplements. Some medicines that may be harmful to your baby can pass through breast milk.  It is possible to become pregnant while breastfeeding. If birth control is desired, ask your health care provider about options that will be safe while breastfeeding your baby. Where to find more information: [employer]: www.llli.org Contact a health care provider if:  You feel like you want to stop breastfeeding or have become frustrated with breastfeeding.  Your nipples are cracked or bleeding.  Your breasts are red, tender, or warm.  You have: ? Painful breasts or nipples. ? A swollen area on either breast. ? A fever or chills. ? Nausea or vomiting. ? Drainage other than breast milk from your nipples.  Your breasts do not become full before feedings by the fifth day after you give birth.  You feel sad and depressed.  Your baby is: ? Too sleepy to eat well. ? Having trouble sleeping. ? More than 15 week old and wetting fewer than 6 diapers in a 24-hour period. ? Not gaining weight by 44 days of age.  Your baby has fewer than 3 stools in a 24-hour period.  Your baby's skin or the white parts of his or her eyes become yellow. Get help right away if:  Your baby is overly tired (lethargic) and does not want to wake up and feed.  Your baby develops an unexplained fever. Summary  Breastfeeding offers many health benefits for infant and mothers.  Try to breastfeed your infant when he or she shows early signs of hunger.  Gently tickle or stroke your baby's lips with your finger or nipple to allow the baby to open his or her mouth.  Bring the baby to your breast. Make sure that much of the areola is in your baby's mouth. Offer one side and burp the baby before you offer the other side.  Talk with your health care provider or lactation consultant if you have questions or you face problems as you breastfeed. This information is not intended to replace advice given to you by your health care provider. Make sure you discuss any questions you have with your health care provider. Document Revised: 11/07/2017 Document Reviewed: 09/14/2016 Elsevier Patient Education  [place].

## 2020-08-05 NOTE — MAU Note (Signed)
..  Beverly Mckenzie is a 19 y.o. at [redacted]w[redacted]d here in MAU reporting: CTX that have been ongoing since yesterday. Was seen in MAU yesterday for CTX, sent home with labor precautions. Reports CTX every 5 minutes. Reports vaginal spotting with mucous. +FM.   Pain score: 10/10 lower abdomen and back

## 2020-08-05 NOTE — Progress Notes (Signed)
   Subjective:  Natalea Sutliff is a 19 y.o. G1P0 at [redacted]w[redacted]d being seen today for ongoing prenatal care.  She is currently monitored for the following issues for this low-risk pregnancy and has Supervision of other normal pregnancy, antepartum; Fibroid, uterine; Asthma; and Genetic carrier on their problem list.  Patient reports no complaints.  Contractions: Irritability. Vag. Bleeding: Scant.  Movement: Present. Denies leaking of fluid.   The following portions of the patient's history were reviewed and updated as appropriate: allergies, current medications, past family history, past medical history, past social history, past surgical history and problem list. Problem list updated.  Objective:   Vitals:   08/05/20 0923  BP: 134/82  Pulse: 84  Weight: 178 lb 9.6 oz (81 kg)    Fetal Status: Fetal Heart Rate (bpm): 139   Movement: Present     General:  Alert, oriented and cooperative. Patient is in no acute distress.  Skin: Skin is warm and dry. No rash noted.   Cardiovascular: Normal heart rate noted  Respiratory: Normal respiratory effort, no problems with respiration noted  Abdomen: Soft, gravid, appropriate for gestational age. Pain/Pressure: Present     Pelvic: Vag. Bleeding: Scant Vag D/C Character: Mucous   Cervical exam deferred        Extremities: Normal range of motion.  Edema: None  Mental Status: Normal mood and affect. Normal behavior. Normal judgment and thought content.   Urinalysis:      Assessment and Plan:  Pregnancy: G1P0 at [redacted]w[redacted]d  1. Supervision of other normal pregnancy, antepartum BP and FHR normal Seen in MAU this morning for labor check, FT/L and no change over two hours Discussed labor precautions  2. 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 Please refer to After Visit Summary for other counseling recommendations.  Return in 1 week (on 08/12/2020) for [facility], ob visit.   Clarnce Flock, MD

## 2020-08-12 ENCOUNTER — Other Ambulatory Visit: Payer: Self-pay

## 2020-08-12 ENCOUNTER — Ambulatory Visit (INDEPENDENT_AMBULATORY_CARE_PROVIDER_SITE_OTHER): Payer: Medicaid Other | Admitting: Family Medicine

## 2020-08-12 VITALS — BP 115/84 | HR 106 | Wt 181.3 lb

## 2020-08-12 DIAGNOSIS — Z3480 Encounter for supervision of other normal pregnancy, unspecified trimester: Secondary | ICD-10-CM

## 2020-08-12 DIAGNOSIS — Z148 Genetic carrier of other disease: Secondary | ICD-10-CM

## 2020-08-12 DIAGNOSIS — J4520 Mild intermittent asthma, uncomplicated: Secondary | ICD-10-CM

## 2020-08-12 DIAGNOSIS — D259 Leiomyoma of uterus, unspecified: Secondary | ICD-10-CM

## 2020-08-12 NOTE — Patient Instructions (Signed)
Contraception Choices Contraception, also called birth control, refers to methods or devices that prevent pregnancy. Hormonal methods Contraceptive implant  A contraceptive implant is a thin, plastic tube that contains a hormone. It is inserted into the upper part of the arm. It can remain in place for up to 3 years. Progestin-only injections Progestin-only injections are injections of progestin, a synthetic form of the hormone progesterone. They are given every 3 months by a health care provider. Birth control pills  Birth control pills are pills that contain hormones that prevent pregnancy. They must be taken once a day, preferably at the same time each day. Birth control patch  The birth control patch contains hormones that prevent pregnancy. It is placed on the skin and must be changed once a week for three weeks and removed on the fourth week. A prescription is needed to use this method of contraception. Vaginal ring  A vaginal ring contains hormones that prevent pregnancy. It is placed in the vagina for three weeks and removed on the fourth week. After that, the process is repeated with a new ring. A prescription is needed to use this method of contraception. Emergency contraceptive Emergency contraceptives prevent pregnancy after unprotected sex. They come in pill form and can be taken up to 5 days after sex. They work best the sooner they are taken after having sex. Most emergency contraceptives are available without a prescription. This method should not be used as your only form of birth control. Barrier methods Female condom  A female condom is a thin sheath that is worn over the penis during sex. Condoms keep sperm from going inside a woman's body. They can be used with a spermicide to increase their effectiveness. They should be disposed after a single use. Female condom  A female condom is a soft, loose-fitting sheath that is put into the vagina before sex. The condom keeps  sperm from going inside a woman's body. They should be disposed after a single use. Diaphragm  A diaphragm is a soft, dome-shaped barrier. It is inserted into the vagina before sex, along with a spermicide. The diaphragm blocks sperm from entering the uterus, and the spermicide kills sperm. A diaphragm should be left in the vagina for 6-8 hours after sex and removed within 24 hours. A diaphragm is prescribed and fitted by a health care provider. A diaphragm should be replaced every 1-2 years, after giving birth, after gaining more than 15 lb (6.8 kg), and after pelvic surgery. Cervical cap  A cervical cap is a round, soft latex or plastic cup that fits over the cervix. It is inserted into the vagina before sex, along with spermicide. It blocks sperm from entering the uterus. The cap should be left in place for 6-8 hours after sex and removed within 48 hours. A cervical cap must be prescribed and fitted by a health care provider. It should be replaced every 2 years. Sponge  A sponge is a soft, circular piece of polyurethane foam with spermicide on it. The sponge helps block sperm from entering the uterus, and the spermicide kills sperm. To use it, you make it wet and then insert it into the vagina. It should be inserted before sex, left in for at least 6 hours after sex, and removed and thrown away within 30 hours. Spermicides Spermicides are chemicals that kill or block sperm from entering the cervix and uterus. They can come as a cream, jelly, suppository, foam, or tablet. A spermicide should be inserted into  the vagina with an applicator at least 92-42 minutes before sex to allow time for it to work. The process must be repeated every time you have sex. Spermicides do not require a prescription. Intrauterine contraception Intrauterine device (IUD) An IUD is a T-shaped device that is put in a woman's uterus. There are two types:  Hormone IUD.This type contains progestin, a synthetic form of the  hormone progesterone. This type can stay in place for 3-5 years.  Copper IUD.This type is wrapped in copper wire. It can stay in place for 10 years.  Permanent methods of contraception Female tubal ligation In this method, a woman's fallopian tubes are sealed, tied, or blocked during surgery to prevent eggs from traveling to the uterus. Hysteroscopic sterilization In this method, a small, flexible insert is placed into each fallopian tube. The inserts cause scar tissue to form in the fallopian tubes and block them, so sperm cannot reach an egg. The procedure takes about 3 months to be effective. Another form of birth control must be used during those 3 months. Female sterilization This is a procedure to tie off the tubes that carry sperm (vasectomy). After the procedure, the man can still ejaculate fluid (semen). Natural planning methods Natural family planning In this method, a couple does not have sex on days when the woman could become pregnant. Calendar method This means keeping track of the length of each menstrual cycle, identifying the days when pregnancy can happen, and not having sex on those days. Ovulation method In this method, a couple avoids sex during ovulation. Symptothermal method This method involves not having sex during ovulation. The woman typically checks for ovulation by watching changes in her temperature and in the consistency of cervical mucus. Post-ovulation method In this method, a couple waits to have sex until after ovulation. Summary  Contraception, also called birth control, means methods or devices that prevent pregnancy.  Hormonal methods of contraception include implants, injections, pills, patches, vaginal rings, and emergency contraceptives.  Barrier methods of contraception can include female condoms, female condoms, diaphragms, cervical caps, sponges, and spermicides.  There are two types of IUDs (intrauterine devices). An IUD can be put in a woman's  uterus to prevent pregnancy for 3-5 years.  Permanent sterilization can be done through a procedure for males, females, or both.  Natural family planning methods involve not having sex on days when the woman could become pregnant. This information is not intended to replace advice given to you by your health care provider. Make sure you discuss any questions you have with your health care provider. Document Revised: 08/15/2017 Document Reviewed: 09/15/2016 Elsevier Patient Education  2020 [employer].   Breastfeeding  Choosing to breastfeed is one of the best decisions you can make for yourself and your baby. A change in hormones during pregnancy causes your breasts to make breast milk in your milk-producing glands. Hormones prevent breast milk from being released before your baby is born. They also prompt milk flow after birth. Once breastfeeding has begun, thoughts of your baby, as well as his or her sucking or crying, can stimulate the release of milk from your milk-producing glands. Benefits of breastfeeding Research shows that breastfeeding offers many health benefits for infants and mothers. It also offers a cost-free and convenient way to feed your baby. For your baby  Your first milk (colostrum) helps your baby's digestive system to function better.  Special cells in your milk (antibodies) help your baby to fight off infections.  Breastfed babies are  less likely to develop asthma, allergies, obesity, or type 2 diabetes. They are also at lower risk for sudden infant death syndrome (SIDS).  Nutrients in breast milk are better able to meet your baby's needs compared to infant formula.  Breast milk improves your baby's brain development. For you  Breastfeeding helps to create a very special bond between you and your baby.  Breastfeeding is convenient. Breast milk costs nothing and is always available at the correct temperature.  Breastfeeding helps to burn calories. It helps you  to lose the weight that you gained during pregnancy.  Breastfeeding makes your uterus return faster to its size before pregnancy. It also slows bleeding (lochia) after you give birth.  Breastfeeding helps to lower your risk of developing type 2 diabetes, osteoporosis, rheumatoid arthritis, cardiovascular disease, and breast, ovarian, uterine, and endometrial cancer later in life. Breastfeeding basics Starting breastfeeding  Find a comfortable place to sit or lie down, with your neck and back well-supported.  Place a pillow or a rolled-up blanket under your baby to bring him or her to the level of your breast (if you are seated). Nursing pillows are specially designed to help support your arms and your baby while you breastfeed.  Make sure that your baby's tummy (abdomen) is facing your abdomen.  Gently massage your breast. With your fingertips, massage from the outer edges of your breast inward toward the nipple. This encourages milk flow. If your milk flows slowly, you may need to continue this action during the feeding.  Support your breast with 4 fingers underneath and your thumb above your nipple (make the letter "C" with your hand). Make sure your fingers are well away from your nipple and your baby's mouth.  Stroke your baby's lips gently with your finger or nipple.  When your baby's mouth is open wide enough, quickly bring your baby to your breast, placing your entire nipple and as much of the areola as possible into your baby's mouth. The areola is the colored area around your nipple. ? More areola should be visible above your baby's upper lip than below the lower lip. ? Your baby's lips should be opened and extended outward (flanged) to ensure an adequate, comfortable latch. ? Your baby's tongue should be between his or her lower gum and your breast.  Make sure that your baby's mouth is correctly positioned around your nipple (latched). Your baby's lips should create a seal on your  breast and be turned out (everted).  It is common for your baby to suck about 2-3 minutes in order to start the flow of breast milk. Latching Teaching your baby how to latch onto your breast properly is very important. An improper latch can cause nipple pain, decreased milk supply, and poor weight gain in your baby. Also, if your baby is not latched onto your nipple properly, he or she may swallow some air during feeding. This can make your baby fussy. Burping your baby when you switch breasts during the feeding can help to get rid of the air. However, teaching your baby to latch on properly is still the best way to prevent fussiness from swallowing air while breastfeeding. Signs that your baby has successfully latched onto your nipple  Silent tugging or silent sucking, without causing you pain. Infant's lips should be extended outward (flanged).  Swallowing heard between every 3-4 sucks once your milk has started to flow (after your let-down milk reflex occurs).  Muscle movement above and in front of his or her  ears while sucking. Signs that your baby has not successfully latched onto your nipple  Sucking sounds or smacking sounds from your baby while breastfeeding.  Nipple pain. If you think your baby has not latched on correctly, slip your finger into the corner of your baby's mouth to break the suction and place it between your baby's gums. Attempt to start breastfeeding again. Signs of successful breastfeeding Signs from your baby  Your baby will gradually decrease the number of sucks or will completely stop sucking.  Your baby will fall asleep.  Your baby's body will relax.  Your baby will retain a small amount of milk in his or her mouth.  Your baby will let go of your breast by himself or herself. Signs from you  Breasts that have increased in firmness, weight, and size 1-3 hours after feeding.  Breasts that are softer immediately after breastfeeding.  Increased milk  volume, as well as a change in milk consistency and color by the fifth day of breastfeeding.  Nipples that are not sore, cracked, or bleeding. Signs that your baby is getting enough milk  Wetting at least 1-2 diapers during the first 24 hours after birth.  Wetting at least 5-6 diapers every 24 hours for the first week after birth. The urine should be clear or pale yellow by the age of 5 days.  Wetting 6-8 diapers every 24 hours as your baby continues to grow and develop.  At least 3 stools in a 24-hour period by the age of 5 days. The stool should be soft and yellow.  At least 3 stools in a 24-hour period by the age of 7 days. The stool should be seedy and yellow.  No loss of weight greater than 10% of birth weight during the first 3 days of life.  Average weight gain of 4-7 oz (113-198 g) per week after the age of 4 days.  Consistent daily weight gain by the age of 5 days, without weight loss after the age of 2 weeks. After a feeding, your baby may spit up a small amount of milk. This is normal. Breastfeeding frequency and duration Frequent feeding will help you make more milk and can prevent sore nipples and extremely full breasts (breast engorgement). Breastfeed when you feel the need to reduce the fullness of your breasts or when your baby shows signs of hunger. This is called "breastfeeding on demand." Signs that your baby is hungry include:  Increased alertness, activity, or restlessness.  Movement of the head from side to side.  Opening of the mouth when the corner of the mouth or cheek is stroked (rooting).  Increased sucking sounds, smacking lips, cooing, sighing, or squeaking.  Hand-to-mouth movements and sucking on fingers or hands.  Fussing or crying. Avoid introducing a pacifier to your baby in the first 4-6 weeks after your baby is born. After this time, you may choose to use a pacifier. Research has shown that pacifier use during the first year of a baby's life  decreases the risk of sudden infant death syndrome (SIDS). Allow your baby to feed on each breast as long as he or she wants. When your baby unlatches or falls asleep while feeding from the first breast, offer the second breast. Because newborns are often sleepy in the first few weeks of life, you may need to awaken your baby to get him or her to feed. Breastfeeding times will vary from baby to baby. However, the following rules can serve as a guide to  help you make sure that your baby is properly fed:  Newborns (babies 60 weeks of age or younger) may breastfeed every 1-3 hours.  Newborns should not go without breastfeeding for longer than 3 hours during the day or 5 hours during the night.  You should breastfeed your baby a minimum of 8 times in a 24-hour period. Breast milk pumping     Pumping and storing breast milk allows you to make sure that your baby is exclusively fed your breast milk, even at times when you are unable to breastfeed. This is especially important if you go back to work while you are still breastfeeding, or if you are not able to be present during feedings. Your lactation consultant can help you find a method of pumping that works best for you and give you guidelines about how long it is safe to store breast milk. Caring for your breasts while you breastfeed Nipples can become dry, cracked, and sore while breastfeeding. The following recommendations can help keep your breasts moisturized and healthy:  Avoid using soap on your nipples.  Wear a supportive bra designed especially for nursing. Avoid wearing underwire-style bras or extremely tight bras (sports bras).  Air-dry your nipples for 3-4 minutes after each feeding.  Use only cotton bra pads to absorb leaked breast milk. Leaking of breast milk between feedings is normal.  Use lanolin on your nipples after breastfeeding. Lanolin helps to maintain your skin's normal moisture barrier. Pure lanolin is not harmful (not  toxic) to your baby. You may also hand express a few drops of breast milk and gently massage that milk into your nipples and allow the milk to air-dry. In the first few weeks after giving birth, some women experience breast engorgement. Engorgement can make your breasts feel heavy, warm, and tender to the touch. Engorgement peaks within 3-5 days after you give birth. The following recommendations can help to ease engorgement:  Completely empty your breasts while breastfeeding or pumping. You may want to start by applying warm, moist heat (in the shower or with warm, water-soaked hand towels) just before feeding or pumping. This increases circulation and helps the milk flow. If your baby does not completely empty your breasts while breastfeeding, pump any extra milk after he or she is finished.  Apply ice packs to your breasts immediately after breastfeeding or pumping, unless this is too uncomfortable for you. To do this: ? Put ice in a plastic bag. ? Place a towel between your skin and the bag. ? Leave the ice on for 20 minutes, 2-3 times a day.  Make sure that your baby is latched on and positioned properly while breastfeeding. If engorgement persists after 48 hours of following these recommendations, contact your health care provider or a Science writer. Overall health care recommendations while breastfeeding  Eat 3 healthy meals and 3 snacks every day. Well-nourished mothers who are breastfeeding need an additional 450-500 calories a day. You can meet this requirement by increasing the amount of a balanced diet that you eat.  Drink enough water to keep your urine pale yellow or clear.  Rest often, relax, and continue to take your prenatal vitamins to prevent fatigue, stress, and low vitamin and mineral levels in your body (nutrient deficiencies).  Do not use any products that contain nicotine or tobacco, such as cigarettes and e-cigarettes. Your baby may be harmed by chemicals from  cigarettes that pass into breast milk and exposure to secondhand smoke. If you need help quitting, ask your  health care provider.  Avoid alcohol.  Do not use illegal drugs or marijuana.  Talk with your health care provider before taking any medicines. These include over-the-counter and prescription medicines as well as vitamins and herbal supplements. Some medicines that may be harmful to your baby can pass through breast milk.  It is possible to become pregnant while breastfeeding. If birth control is desired, ask your health care provider about options that will be safe while breastfeeding your baby. Where to find more information: [employer]: www.llli.org Contact a health care provider if:  You feel like you want to stop breastfeeding or have become frustrated with breastfeeding.  Your nipples are cracked or bleeding.  Your breasts are red, tender, or warm.  You have: ? Painful breasts or nipples. ? A swollen area on either breast. ? A fever or chills. ? Nausea or vomiting. ? Drainage other than breast milk from your nipples.  Your breasts do not become full before feedings by the fifth day after you give birth.  You feel sad and depressed.  Your baby is: ? Too sleepy to eat well. ? Having trouble sleeping. ? More than 62 week old and wetting fewer than 6 diapers in a 24-hour period. ? Not gaining weight by 86 days of age.  Your baby has fewer than 3 stools in a 24-hour period.  Your baby's skin or the white parts of his or her eyes become yellow. Get help right away if:  Your baby is overly tired (lethargic) and does not want to wake up and feed.  Your baby develops an unexplained fever. Summary  Breastfeeding offers many health benefits for infant and mothers.  Try to breastfeed your infant when he or she shows early signs of hunger.  Gently tickle or stroke your baby's lips with your finger or nipple to allow the baby to open his or her mouth.  Bring the baby to your breast. Make sure that much of the areola is in your baby's mouth. Offer one side and burp the baby before you offer the other side.  Talk with your health care provider or lactation consultant if you have questions or you face problems as you breastfeed. This information is not intended to replace advice given to you by your health care provider. Make sure you discuss any questions you have with your health care provider. Document Revised: 11/07/2017 Document Reviewed: 09/14/2016 Elsevier Patient Education  [employer].

## 2020-08-12 NOTE — Progress Notes (Signed)
   Subjective:  Beverly Mckenzie is a 19 y.o. G1P0 at [redacted]w[redacted]d being seen today for ongoing prenatal care.  She is currently monitored for the following issues for this low-risk pregnancy and has Supervision of other normal pregnancy, antepartum; Fibroid, uterine; Asthma; and Genetic carrier on their problem list.  Patient reports cramping.  Contractions: Not present. Vag. Bleeding: None.  Movement: Present. Denies leaking of fluid.   The following portions of the patient's history were reviewed and updated as appropriate: allergies, current medications, past family history, past medical history, past social history, past surgical history and problem list. Problem list updated.  Objective:   Vitals:   08/12/20 1018  BP: 115/84  Pulse: (!) 106  Weight: 181 lb 4.8 oz (82.2 kg)    Fetal Status: Fetal Heart Rate (bpm): 141   Movement: Present     General:  Alert, oriented and cooperative. Patient is in no acute distress.  Skin: Skin is warm and dry. No rash noted.   Cardiovascular: Normal heart rate noted  Respiratory: Normal respiratory effort, no problems with respiration noted  Abdomen: Soft, gravid, appropriate for gestational age. Pain/Pressure: Present     Pelvic: Vag. Bleeding: None     Cervical exam deferred        Extremities: Normal range of motion.  Edema: None  Mental Status: Normal mood and affect. Normal behavior. Normal judgment and thought content.   Urinalysis:      Assessment and Plan:  Pregnancy: G1P0 at [redacted]w[redacted]d  1. 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 Mild intermittent asthma without complication   4. Uterine leiomyoma, unspecified location Not seen on recent US  Term labor symptoms and general  obstetric precautions including but not limited to vaginal bleeding, contractions, leaking of fluid and fetal movement were reviewed in detail with the patient. Please refer to After Visit Summary for other counseling recommendations.  Return in 1 week (on 08/19/2020).   Clarnce Flock, MD

## 2020-08-18 ENCOUNTER — Other Ambulatory Visit: Payer: Self-pay

## 2020-08-18 ENCOUNTER — Encounter (HOSPITAL_COMMUNITY): Payer: Self-pay | Admitting: *Deleted

## 2020-08-18 ENCOUNTER — Encounter: Payer: Self-pay | Admitting: *Deleted

## 2020-08-18 ENCOUNTER — Telehealth (HOSPITAL_COMMUNITY): Payer: Self-pay | Admitting: *Deleted

## 2020-08-18 ENCOUNTER — Ambulatory Visit (INDEPENDENT_AMBULATORY_CARE_PROVIDER_SITE_OTHER): Payer: Medicaid Other | Admitting: Obstetrics & Gynecology

## 2020-08-18 VITALS — BP 122/83 | HR 103 | Wt 182.6 lb

## 2020-08-18 DIAGNOSIS — Z3480 Encounter for supervision of other normal pregnancy, unspecified trimester: Secondary | ICD-10-CM

## 2020-08-18 DIAGNOSIS — Z3A39 39 weeks gestation of pregnancy: Secondary | ICD-10-CM

## 2020-08-18 DIAGNOSIS — J4520 Mild intermittent asthma, uncomplicated: Secondary | ICD-10-CM

## 2020-08-18 DIAGNOSIS — D259 Leiomyoma of uterus, unspecified: Secondary | ICD-10-CM

## 2020-08-18 NOTE — Telephone Encounter (Signed)
Preadmission screen  

## 2020-08-18 NOTE — Progress Notes (Signed)
   PRENATAL VISIT NOTE  Subjective:  Beverly Mckenzie is a 19 y.o. G1P0 at [redacted]w[redacted]d being seen today for ongoing prenatal care.  She is currently monitored for the following issues for this low-risk pregnancy and has Supervision of other normal pregnancy, antepartum; Fibroid, uterine; Asthma; and Genetic carrier on their problem list.  Patient reports no complaints.  Contractions: Irregular. Vag. Bleeding: None.  Movement: Present. Denies leaking of fluid.   FOB is home from [employer] and will have to go back on 08/26/2020.  She and Dr. Dione Plover discussed this last week and decided to have induction at 40 weeks.    The following portions of the patient's history were reviewed and updated as appropriate: allergies, current medications, past family history, past medical history, past social history, past surgical history and problem list.   Objective:   Vitals:   08/18/20 0819  BP: 122/83  Pulse: (!) 103  Weight: 182 lb 9.6 oz (82.8 kg)    Fetal Status: Fetal Heart Rate (bpm): 143   Movement: Present     General:  Alert, oriented and cooperative. Patient is in no acute distress.  Skin: Skin is warm and dry. No rash noted.   Cardiovascular: Normal heart rate noted  Respiratory: Normal respiratory effort, no problems with respiration noted  Abdomen: Soft, gravid, appropriate for gestational age.  Pain/Pressure: Absent     Pelvic: Cervical exam performed in the presence of a chaperone      could not reach cervix, posterior and to the left  Extremities: Normal range of motion.  Edema: None  Mental Status: Normal mood and affect. Normal behavior. Normal judgment and thought content.   Assessment and Plan:  Pregnancy: G1P0 at [redacted]w[redacted]d 1. [redacted] weeks gestation of pregnancy - return 08/22/2020 for consideration of foley bulb - Induction scheduled 08/23/2020.  Schedule already has 9 patients on it.  Pt aware could be late or even 08/24/2020 before gets called by hospital.  2. Supervision of other  normal pregnancy, antepartum - GBS negative  3. Mild intermittent asthma without complication  4. Uterine leiomyoma, unspecified location  Term labor symptoms and general obstetric precautions including but not limited to vaginal bleeding, contractions, leaking of fluid and fetal movement were reviewed in detail with the patient. Please refer to After Visit Summary for other counseling recommendations.    Future Appointments  Date Time Provider Department Center  08/25/2020  9:15 AM Clarnce Flock, MD [facility]    Megan Salon, MD

## 2020-08-18 NOTE — Progress Notes (Signed)
IOL  Scheduled for 08/23/20 . Dornell Grasmick,RN

## 2020-08-22 ENCOUNTER — Encounter (HOSPITAL_COMMUNITY): Payer: Self-pay | Admitting: Obstetrics & Gynecology

## 2020-08-22 ENCOUNTER — Other Ambulatory Visit (HOSPITAL_COMMUNITY): Payer: Self-pay | Admitting: Advanced Practice Midwife

## 2020-08-22 ENCOUNTER — Encounter: Payer: Self-pay | Admitting: Certified Nurse Midwife

## 2020-08-22 ENCOUNTER — Other Ambulatory Visit (HOSPITAL_COMMUNITY)
Admission: RE | Admit: 2020-08-22 | Discharge: 2020-08-22 | Disposition: A | Discharge status: Home / Self Care | Payer: Medicaid Other | Source: Ambulatory Visit | Attending: Family Medicine | Admitting: Family Medicine

## 2020-08-22 ENCOUNTER — Ambulatory Visit (INDEPENDENT_AMBULATORY_CARE_PROVIDER_SITE_OTHER): Payer: Medicaid Other | Admitting: Certified Nurse Midwife

## 2020-08-22 ENCOUNTER — Other Ambulatory Visit: Payer: Self-pay

## 2020-08-22 ENCOUNTER — Other Ambulatory Visit (HOSPITAL_COMMUNITY): Admission: AD | Admit: 2020-08-22 | Payer: Medicaid Other | Source: Home / Self Care | Admitting: Family Medicine

## 2020-08-22 ENCOUNTER — Inpatient Hospital Stay (HOSPITAL_COMMUNITY)
Admission: AD | Admit: 2020-08-22 | Discharge: 2020-08-25 | DRG: 768 | Disposition: A | Discharge status: Home / Self Care | Payer: Medicaid Other | Attending: Family Medicine | Admitting: Family Medicine

## 2020-08-22 VITALS — BP 119/77 | HR 102 | Wt 186.8 lb

## 2020-08-22 DIAGNOSIS — D259 Leiomyoma of uterus, unspecified: Secondary | ICD-10-CM | POA: Diagnosis present

## 2020-08-22 DIAGNOSIS — O3413 Maternal care for benign tumor of corpus uteri, third trimester: Principal | ICD-10-CM | POA: Diagnosis present

## 2020-08-22 DIAGNOSIS — Z3A39 39 weeks gestation of pregnancy: Secondary | ICD-10-CM

## 2020-08-22 DIAGNOSIS — Z3480 Encounter for supervision of other normal pregnancy, unspecified trimester: Secondary | ICD-10-CM

## 2020-08-22 DIAGNOSIS — J45909 Unspecified asthma, uncomplicated: Secondary | ICD-10-CM | POA: Diagnosis present

## 2020-08-22 DIAGNOSIS — Z01812 Encounter for preprocedural laboratory examination: Secondary | ICD-10-CM | POA: Insufficient documentation

## 2020-08-22 DIAGNOSIS — Z20822 Contact with and (suspected) exposure to covid-19: Secondary | ICD-10-CM | POA: Insufficient documentation

## 2020-08-22 DIAGNOSIS — Z30017 Encounter for initial prescription of implantable subdermal contraceptive: Secondary | ICD-10-CM

## 2020-08-22 DIAGNOSIS — Z148 Genetic carrier of other disease: Secondary | ICD-10-CM

## 2020-08-22 LAB — SARS CORONAVIRUS 2 (TAT 6-24 HRS): SARS Coronavirus 2: NEGATIVE

## 2020-08-22 NOTE — MAU Note (Signed)
PT SAYS INSERTED FOLEY BULB- 4PM- NOTICED BLEEDING AT 2200.- IN TRIAGE - BLOODY SHOW ON PAD.    UC'S FEEL STRONGER.    WAS 1 CM .   DENIES HSV AND MRSA.  GBS- NEG

## 2020-08-22 NOTE — Progress Notes (Signed)
Pt is scheduled for IOL tomorrow.

## 2020-08-22 NOTE — Patient Instructions (Signed)
OUTPATIENT FOLEY BULB INDUCTION OF LABOR:                What's a Foley Bulb Induction? A Foley bulb induction is a procedure where your provider inserts a catheter into your cervix. Once inside your womb, your provider inflates the balloon with a saline solution.   This puts pressure on your cervix and encourages dilation. The catheter falls out once your cervix dilates to 4 centimeters.     With any procedure, it's important that you know what to expect. The insertion of a Foley catheter can be a bit uncomfortable, and some women experience sharp pelvic pain. The pain may subside once the catheter is in place. You may experience some cramping when the Foley catheter is in place.  This is normal.     GO TO THE MATERNITY ADMISSIONS UNIT FOR THE FOLLOWING:  Heavy vaginal bleeding  Rupture of membranes (fluid that wets your underwear)  Painful uterine contractions every 5 minutes or less  Severe abdominal discomfort  Decreased movement of the baby

## 2020-08-22 NOTE — H&P (Signed)
OBSTETRIC ADMISSION HISTORY AND PHYSICAL  Beverly Mckenzie is a 19 y.o. female G1P0 with IUP at [redacted]w[redacted]d by LMP presenting for contractions. FB placed in the office today and she was scheduled for IOL tomorrow morning. FB pulled out in MAU today and patient admitted to L&D to begin IOL. She has some bloody show. She reports +FMs, No LOF, no blurry vision, headaches or peripheral edema, and RUQ pain.  She plans on breast feeding. She request nexplanon for birth control. She received her prenatal [facility] at [facility].  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, variant for hurler syndrome) Asthma (well controlled) Uterine fibroid  Past Medical History: Past Medical History:  Diagnosis Date   Asthma    Depression    Heart murmur     Past Surgical History: Past Surgical History:  Procedure Laterality Date   NO PAST SURGERIES      Obstetrical History: OB History    Gravida  1   Para      Term      Preterm      AB      Living        SAB      IAB      Ectopic      Multiple      Live Births              Social History Social History   Socioeconomic History   Marital status: Single    Spouse name: Not on file   Number of children: Not on file   Years of education: Not on file   Highest education level: Not on file  Occupational History   Not on file  Tobacco Use   Smoking status: Never Smoker   Smokeless tobacco: Never Used  Vaping Use   Vaping Use: Never used  Substance and Sexual Activity   Alcohol use: No   Drug use: No   Sexual activity: Not Currently  Other Topics Concern   Not on file  Social History Narrative   Not on file   Social Determinants of Health   Financial Resource Strain: Not on file  Food Insecurity: No Food Insecurity   Worried About Running Out  of Food in the Last Year: Never true   Ran Out of Food in the Last Year: Never true  Transportation Needs: No Transportation Needs   Lack of Transportation (Medical): No   Lack of Transportation (Non-Medical): No  Physical Activity: Not on file  Stress: Not on file  Social Connections: Not on file    Family History: Family History  Problem Relation Age of Onset   Healthy Mother    Healthy Father     Allergies: Allergies  Allergen Reactions   Justicia Adhatoda (Malabar Nut Tree) [Justicia Adhatoda] Anaphylaxis and Hives    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 1 Device 0    Iron, Ferrous Sulfate, 325 (65 Fe) MG TABS Take 1 tablet by mouth daily. 30 tablet 3    Prenatal Vit-Fe Fumarate-FA (PRENATAL MULTIVITAMIN) TABS tablet Take  1 tablet by mouth daily at 12 noon.        Review of Systems   All systems reviewed and negative except as stated in HPI  Blood pressure 119/83, pulse (!) 103, temperature 98.7 F (37.1 C), temperature source Oral, resp. rate 20, height 5' 7.5" (1.715 m), weight 83.5 kg, last menstrual period 11/17/2019. General appearance: alert, cooperative and no distress Lungs: normal respiratory effort Heart: regular rate and rhythm Abdomen: soft, non-tender; gravid Pelvic: as noted below Extremities: Homans sign is negative, no sign of DVT Presentation: cephalic by RN cervical exam Fetal monitoringBaseline: 145 bpm, Variability: Good {> 6 bpm), Accelerations: Reactive and Decelerations: Absent Uterine activityFrequency: Every 1-5 minutes     Prenatal labs: ABO, Rh: A/Positive/-- (08/03 1524) Antibody: Negative (08/03 1524) Rubella: 1.72 (08/03 1524) RPR: Non Reactive (09/27 0831)  HBsAg:  Negative (08/03 1524)  HIV: Non Reactive (09/27 0831)  GBS: Negative/-- (12/03 1105)  2 hr Glucola passed Genetic screening  SMA carrier and variant for Hurler syndrome Anatomy US normal  Prenatal Transfer Tool  Maternal Diabetes: No Genetic Screening: Abnormal:  Results: Other:as noted above Maternal Ultrasounds/Referrals: Normal Fetal Ultrasounds or other Referrals:  None Maternal Substance Abuse:  No Significant Maternal Medications:  None Significant Maternal Lab Results: Group B Strep negative  Results for orders placed or performed during the hospital encounter of 08/22/20 (from the past 24 hour(s))  SARS CORONAVIRUS 2 (TAT 6-24 HRS) Nasopharyngeal Nasopharyngeal Swab   Collection Time: 08/22/20 10:30 AM   Specimen: Nasopharyngeal Swab  Result Value Ref Range   SARS Coronavirus 2 NEGATIVE NEGATIVE    Patient Active Problem List   Diagnosis Date Noted   Genetic carrier 04/16/2020   Supervision of other normal pregnancy, antepartum 03/29/2020   Fibroid, uterine 03/29/2020   Asthma     Assessment/Plan:  Beverly Mckenzie is a 19 y.o. G1P0 at [redacted]w[redacted]d here for eIOL.  #IOL: FB placed in clinic today 12/27 and patient presented to MAU this evening with bloody show and contractions. FB dislodged in MAU and patient admitted. Patient contracting every 2 minutes and feeling intense pressure/pain, will continue expectant management at this time and if unchanged at next cervical exam will start pitocin. #Pain: PRN #FWB:  cat 1 #ID: GBS neg #MOF: breast #MOC:nexplanon #Circ: yes  Alric Seton, MD  08/22/2020, 10:51 PM

## 2020-08-22 NOTE — Progress Notes (Signed)
   PRENATAL VISIT NOTE  Subjective:  Beverly Mckenzie is a 19 y.o. G1P0 at [redacted]w[redacted]d being seen today for ongoing prenatal care.  She is currently monitored for the following issues for this low-risk pregnancy and has Supervision of other normal pregnancy, antepartum; Fibroid, uterine; Asthma; and Genetic carrier on their problem list.  Patient reports occasional contractions.  Contractions: Irregular. Vag. Bleeding: None.  Movement: Present. Denies leaking of fluid.   The following portions of the patient's history were reviewed and updated as appropriate: allergies, current medications, past family history, past medical history, past social history, past surgical history and problem list. Problem list updated.  Objective:   Vitals:   08/22/20 1503  BP: 119/77  Pulse: (!) 102  Weight: 186 lb 12.8 oz (84.7 kg)    Fetal Status: Fetal Heart Rate (bpm): 142   Movement: Present  Presentation: Vertex  General:  Alert, oriented and cooperative. Patient is in no acute distress.  Skin: Skin is warm and dry. No rash noted.   Cardiovascular: Normal heart rate noted  Respiratory: Normal respiratory effort, no problems with respiration noted  Abdomen: Soft, gravid, appropriate for gestational age.  Pain/Pressure: Absent     Pelvic: Cervical exam performed Dilation: 1 Effacement (%): 50 Station: -3  Extremities: Normal range of motion.  Edema: None  Mental Status:  Normal mood and affect. Normal behavior. Normal judgment and thought content.  Procedure: Patient informed of R/B/A of procedure. NST was performed and was reactive prior to procedure. NST:  EFM: Baseline: 140 bpm, Variability: Good {> 6 bpm), Accelerations: Reactive and Decelerations: Absent Toco: 3-6 minutes/mild by palpation  Procedure done to begin ripening of the cervix prior to admission for induction of labor. Appropriate time out taken. The patient was placed in the lithotomy position and a cervical exam was performed and a  finger was used to guide the 60F foley through the internal os of the cervix. Foley Balloon filled with 60cc of sterile water. Plug inserted into end of the foley. Foley placed on tension and taped to medial thigh.  NST:  EFM Baseline: 135 bpm, Variability: Good {> 6 bpm), Accelerations: Reactive and Decelerations: Absent  Toco: 3-5 minutes There were no signs of tachysystole or hypertonus. All equipment was removed and accounted for. The patient tolerated the procedure well.  Assessment and Plan:  Pregnancy: G1P0 at [redacted]w[redacted]d 1. Supervision of other normal pregnancy, antepartum - Patient is scheduled tomorrow for IOL due to partner being in the [place] and being deployed on 12/31 - Fetal nonstress test  2. [redacted] weeks gestation of pregnancy - Foley bulb inserted today, discussed precautions and reasons to present to MAU  - Fetal nonstress test   S/p Outpatient placement of foley balloon catheter for cervical ripening. Induction of labor scheduled for tomorrow at 0730 am. Reassuring FHR tracing with no concerns at present. Warning signs given to patient to include return to MAU for heavy vaginal bleeding, Rupture of membranes, painful uterine contractions q 5 mins or less, severe abdominal discomfort, decreased fetal movement.  Return in about 5 weeks (around 09/26/2020) for POSTPARTUM.   Lajean Manes, CNM 08/22/2020 4:55 PM

## 2020-08-23 ENCOUNTER — Inpatient Hospital Stay (HOSPITAL_COMMUNITY): Payer: Medicaid Other

## 2020-08-23 ENCOUNTER — Inpatient Hospital Stay (HOSPITAL_COMMUNITY): Admission: AD | Admit: 2020-08-23 | Payer: Medicaid Other | Source: Home / Self Care | Admitting: Family Medicine

## 2020-08-23 ENCOUNTER — Inpatient Hospital Stay (HOSPITAL_COMMUNITY): Payer: Medicaid Other | Admitting: Anesthesiology

## 2020-08-23 ENCOUNTER — Encounter (HOSPITAL_COMMUNITY): Payer: Self-pay | Admitting: Obstetrics & Gynecology

## 2020-08-23 ENCOUNTER — Inpatient Hospital Stay (HOSPITAL_COMMUNITY)
Admission: AD | Admit: 2020-08-23 | Payer: Medicaid Other | Source: Home / Self Care | Admitting: Obstetrics & Gynecology

## 2020-08-23 DIAGNOSIS — Z30017 Encounter for initial prescription of implantable subdermal contraceptive: Secondary | ICD-10-CM | POA: Diagnosis not present

## 2020-08-23 DIAGNOSIS — Z3A39 39 weeks gestation of pregnancy: Secondary | ICD-10-CM | POA: Diagnosis not present

## 2020-08-23 DIAGNOSIS — Z148 Genetic carrier of other disease: Secondary | ICD-10-CM | POA: Diagnosis not present

## 2020-08-23 DIAGNOSIS — Z3A40 40 weeks gestation of pregnancy: Secondary | ICD-10-CM | POA: Diagnosis not present

## 2020-08-23 DIAGNOSIS — O26893 Other specified pregnancy related conditions, third trimester: Secondary | ICD-10-CM | POA: Diagnosis present

## 2020-08-23 DIAGNOSIS — Z20822 Contact with and (suspected) exposure to covid-19: Secondary | ICD-10-CM | POA: Diagnosis present

## 2020-08-23 DIAGNOSIS — O480 Post-term pregnancy: Secondary | ICD-10-CM | POA: Diagnosis not present

## 2020-08-23 DIAGNOSIS — D259 Leiomyoma of uterus, unspecified: Secondary | ICD-10-CM | POA: Diagnosis present

## 2020-08-23 DIAGNOSIS — O3413 Maternal care for benign tumor of corpus uteri, third trimester: Secondary | ICD-10-CM | POA: Diagnosis present

## 2020-08-23 LAB — RPR: RPR Ser Ql: NONREACTIVE

## 2020-08-23 LAB — TYPE AND SCREEN
ABO/RH(D): A POS
Antibody Screen: NEGATIVE

## 2020-08-23 LAB — CBC
HCT: 31.7 % — ABNORMAL LOW (ref 36.0–46.0)
Hemoglobin: 10.6 g/dL — ABNORMAL LOW (ref 12.0–15.0)
MCH: 28.1 pg (ref 26.0–34.0)
MCHC: 33.4 g/dL (ref 30.0–36.0)
MCV: 84.1 fL (ref 80.0–100.0)
Platelets: 269 10*3/uL (ref 150–400)
RBC: 3.77 MIL/uL — ABNORMAL LOW (ref 3.87–5.11)
RDW: 15 % (ref 11.5–15.5)
WBC: 11.5 10*3/uL — ABNORMAL HIGH (ref 4.0–10.5)
nRBC: 0 % (ref 0.0–0.2)

## 2020-08-23 MED ORDER — ACETAMINOPHEN 325 MG PO TABS: 650.0000 mg | ORAL_TABLET | ORAL | Status: DC | PRN

## 2020-08-23 MED ORDER — SOD CITRATE-CITRIC ACID 500-334 MG/5ML PO SOLN: 30.0000 mL | ORAL | Status: DC | PRN

## 2020-08-23 MED ORDER — LIDOCAINE HCL (PF) 1 % IJ SOLN
30.0000 mL | INTRAMUSCULAR | Status: AC | PRN
  Administered 2020-08-23: 30 mL via SUBCUTANEOUS
  Filled 2020-08-23: qty 30

## 2020-08-23 MED ORDER — OXYTOCIN BOLUS FROM INFUSION: 333.0000 mL | Freq: Once | INTRAVENOUS | Status: DC

## 2020-08-23 MED ORDER — FENTANYL-BUPIVACAINE-NACL 0.5-0.125-0.9 MG/250ML-% EP SOLN
12.0000 mL/h | EPIDURAL | Status: DC | PRN
  Filled 2020-08-23: qty 250

## 2020-08-23 MED ORDER — EPHEDRINE 5 MG/ML INJ: 10.0000 mg | INTRAVENOUS | Status: DC | PRN

## 2020-08-23 MED ORDER — TRANEXAMIC ACID-NACL 1000-0.7 MG/100ML-% IV SOLN: 1000.0000 mg | INTRAVENOUS | Status: DC

## 2020-08-23 MED ORDER — ZOLPIDEM TARTRATE 5 MG PO TABS: 5.0000 mg | ORAL_TABLET | Freq: Every evening | ORAL | Status: DC | PRN

## 2020-08-23 MED ORDER — PHENYLEPHRINE 40 MCG/ML (10ML) SYRINGE FOR IV PUSH (FOR BLOOD PRESSURE SUPPORT): 80.0000 ug | PREFILLED_SYRINGE | INTRAVENOUS | Status: DC | PRN

## 2020-08-23 MED ORDER — MEASLES, MUMPS & RUBELLA VAC IJ SOLR: 0.5000 mL | Freq: Once | INTRAMUSCULAR | Status: DC

## 2020-08-23 MED ORDER — METHYLERGONOVINE MALEATE 0.2 MG/ML IJ SOLN
INTRAMUSCULAR | Status: AC
  Filled 2020-08-23: qty 1

## 2020-08-23 MED ORDER — TERBUTALINE SULFATE 1 MG/ML IJ SOLN: 0.2500 mg | Freq: Once | INTRAMUSCULAR | Status: DC | PRN

## 2020-08-23 MED ORDER — TETANUS-DIPHTH-ACELL PERTUSSIS 5-2.5-18.5 LF-MCG/0.5 IM SUSY: 0.5000 mL | PREFILLED_SYRINGE | Freq: Once | INTRAMUSCULAR | Status: DC

## 2020-08-23 MED ORDER — PRENATAL MULTIVITAMIN CH
1.0000 | ORAL_TABLET | Freq: Every day | ORAL | Status: DC
  Administered 2020-08-24: 1 via ORAL
  Filled 2020-08-23: qty 1

## 2020-08-23 MED ORDER — POLYETHYLENE GLYCOL 3350 17 G PO PACK
17.0000 g | PACK | Freq: Every day | ORAL | Status: DC
  Administered 2020-08-24 – 2020-08-25 (×2): 17 g via ORAL
  Filled 2020-08-23 (×2): qty 1

## 2020-08-23 MED ORDER — DIPHENHYDRAMINE HCL 25 MG PO CAPS: 25.0000 mg | ORAL_CAPSULE | Freq: Four times a day (QID) | ORAL | Status: DC | PRN

## 2020-08-23 MED ORDER — LACTATED RINGERS IV SOLN
500.0000 mL | Freq: Once | INTRAVENOUS | Status: AC
  Administered 2020-08-23: 500 mL via INTRAVENOUS

## 2020-08-23 MED ORDER — IBUPROFEN 600 MG PO TABS
600.0000 mg | ORAL_TABLET | Freq: Four times a day (QID) | ORAL | Status: DC
  Administered 2020-08-23 – 2020-08-24 (×3): 600 mg via ORAL
  Filled 2020-08-23 (×3): qty 1

## 2020-08-23 MED ORDER — LACTATED RINGERS IV SOLN: 500.0000 mL | INTRAVENOUS | Status: DC | PRN

## 2020-08-23 MED ORDER — LACTATED RINGERS IV SOLN: INTRAVENOUS | Status: DC

## 2020-08-23 MED ORDER — OXYCODONE-ACETAMINOPHEN 5-325 MG PO TABS: 2.0000 | ORAL_TABLET | ORAL | Status: DC | PRN

## 2020-08-23 MED ORDER — LACTATED RINGERS IV SOLN: 500.0000 mL | Freq: Once | INTRAVENOUS | Status: DC

## 2020-08-23 MED ORDER — SODIUM CHLORIDE (PF) 0.9 % IJ SOLN
INTRAMUSCULAR | Status: DC | PRN
  Administered 2020-08-23: 12 mL/h via EPIDURAL

## 2020-08-23 MED ORDER — OXYTOCIN-SODIUM CHLORIDE 30-0.9 UT/500ML-% IV SOLN
2.5000 [IU]/h | INTRAVENOUS | Status: DC
  Filled 2020-08-23: qty 500

## 2020-08-23 MED ORDER — ONDANSETRON HCL 4 MG/2ML IJ SOLN: 4.0000 mg | INTRAMUSCULAR | Status: DC | PRN

## 2020-08-23 MED ORDER — ONDANSETRON HCL 4 MG/2ML IJ SOLN: 4.0000 mg | Freq: Four times a day (QID) | INTRAMUSCULAR | Status: DC | PRN

## 2020-08-23 MED ORDER — SENNOSIDES-DOCUSATE SODIUM 8.6-50 MG PO TABS
2.0000 | ORAL_TABLET | ORAL | Status: DC
  Administered 2020-08-24 – 2020-08-25 (×2): 2 via ORAL
  Filled 2020-08-23 (×2): qty 2

## 2020-08-23 MED ORDER — BENZOCAINE-MENTHOL 20-0.5 % EX AERO
1.0000 "application " | INHALATION_SPRAY | CUTANEOUS | Status: DC | PRN
  Administered 2020-08-24: 1 via TOPICAL
  Filled 2020-08-23: qty 56

## 2020-08-23 MED ORDER — DIBUCAINE (PERIANAL) 1 % EX OINT: 1.0000 "application " | TOPICAL_OINTMENT | CUTANEOUS | Status: DC | PRN

## 2020-08-23 MED ORDER — FENTANYL CITRATE (PF) 100 MCG/2ML IJ SOLN
50.0000 ug | INTRAMUSCULAR | Status: DC | PRN
  Administered 2020-08-23: 100 ug via INTRAVENOUS
  Filled 2020-08-23: qty 2

## 2020-08-23 MED ORDER — METHYLERGONOVINE MALEATE 0.2 MG/ML IJ SOLN
0.2000 mg | Freq: Once | INTRAMUSCULAR | Status: AC
  Administered 2020-08-23: 0.2 mg via INTRAMUSCULAR

## 2020-08-23 MED ORDER — FENTANYL CITRATE (PF) 100 MCG/2ML IJ SOLN
INTRAMUSCULAR | Status: AC
  Administered 2020-08-23: 100 ug via INTRAVENOUS
  Filled 2020-08-23: qty 2

## 2020-08-23 MED ORDER — TRANEXAMIC ACID-NACL 1000-0.7 MG/100ML-% IV SOLN
INTRAVENOUS | Status: AC
  Administered 2020-08-23: 1000 mg
  Filled 2020-08-23: qty 100

## 2020-08-23 MED ORDER — SIMETHICONE 80 MG PO CHEW
80.0000 mg | CHEWABLE_TABLET | ORAL | Status: DC | PRN
Start: 2020-08-23 — End: 2020-08-25

## 2020-08-23 MED ORDER — DIPHENHYDRAMINE HCL 50 MG/ML IJ SOLN: 12.5000 mg | INTRAMUSCULAR | Status: DC | PRN

## 2020-08-23 MED ORDER — OXYCODONE-ACETAMINOPHEN 5-325 MG PO TABS: 1.0000 | ORAL_TABLET | ORAL | Status: DC | PRN

## 2020-08-23 MED ORDER — ONDANSETRON HCL 4 MG PO TABS: 4.0000 mg | ORAL_TABLET | ORAL | Status: DC | PRN

## 2020-08-23 MED ORDER — WITCH HAZEL-GLYCERIN EX PADS: 1.0000 "application " | MEDICATED_PAD | CUTANEOUS | Status: DC | PRN

## 2020-08-23 MED ORDER — COCONUT OIL OIL: 1.0000 "application " | TOPICAL_OIL | Status: DC | PRN

## 2020-08-23 MED ORDER — OXYTOCIN-SODIUM CHLORIDE 30-0.9 UT/500ML-% IV SOLN
1.0000 m[IU]/min | INTRAVENOUS | Status: DC
  Administered 2020-08-23: 2 m[IU]/min via INTRAVENOUS

## 2020-08-23 MED ORDER — FLEET ENEMA 7-19 GM/118ML RE ENEM: 1.0000 | ENEMA | RECTAL | Status: DC | PRN

## 2020-08-23 MED ORDER — LIDOCAINE HCL (PF) 1 % IJ SOLN
INTRAMUSCULAR | Status: DC | PRN
  Administered 2020-08-23 (×2): 4 mL via EPIDURAL

## 2020-08-23 MED ORDER — HYDROXYZINE HCL 50 MG PO TABS: 50.0000 mg | ORAL_TABLET | Freq: Four times a day (QID) | ORAL | Status: DC | PRN

## 2020-08-23 NOTE — Progress Notes (Signed)
Labor Progress Note Beverly Mckenzie is a 19 y.o. G1P0 at [redacted]w[redacted]d presented for eIOL after outpatient FB dislodged 12/27. S: Doing well without complaints.  O:  BP 121/79   Pulse 86   Temp 98.1 F (36.7 C) (Oral)   Resp 17   Ht 5' 7.5" (1.715 m)   Wt 83.5 kg   LMP 11/17/2019   BMI 28.39 kg/m  EFM: baseline 145bpm/mod variability/+ accels/no decels  CVE: Dilation: 5 Effacement (%): 30 Station: -2 Presentation: Vertex Exam by:: Germaine Pomfret, MD   A&P: 19 y.o. G1P0 [redacted]w[redacted]d presented for eIOL after outpatient FB dislodged 12/27. #IOL: S/p outpt FB, dislodged last night in MAU. Patient was initially feeling contractions every minute so expectant management was initiated at that time. Given patient has not made any cervical change since last exam will initiate pitocin. #Pain: PRN, desires epidural  #FWB: cat 1 #GBS negative #Asthma: well controlled, no daily inhalers, no history of hospitalization/intubation.  Alric Seton, MD 6:24 AM

## 2020-08-23 NOTE — Progress Notes (Signed)
LABOR PROGRESS NOTE  Beverly Mckenzie is a 19 y.o. G1P0 at [redacted]w[redacted]d  admitted for eIOL after FB dislodged 12/27.   Subjective: Epidural in place--fairly comfortable. Feeling some more pressure. Patient's mother and grandmother at bedside for support.   Objective: BP 109/64   Pulse 80   Temp 98.5 F (36.9 C) (Oral)   Resp 18   Ht 5' 7.5" (1.715 m)   Wt 83.5 kg   LMP 11/17/2019   SpO2 100%   BMI 28.39 kg/m  or  Vitals:   08/23/20 1300 08/23/20 1305 08/23/20 1310 08/23/20 1315  BP: 109/64     Pulse: 80     Resp:      Temp:      TempSrc:      SpO2: 100% 100% 100% 100%  Weight:      Height:         Dilation: 6.5 Effacement (%): 80 Station: -1 Presentation: Vertex Exam by:: Dr. Marcy Siren FHT: baseline rate 150, moderate varibility, +acel, occ variable and early decel Toco: q1-3 min   Labs: Lab Results  Component Value Date   WBC 11.5 (H) 08/22/2020   HGB 10.6 (L) 08/22/2020   HCT 31.7 (L) 08/22/2020   MCV 84.1 08/22/2020   PLT 269 08/22/2020    Patient Active Problem List   Diagnosis Date Noted  . Indication for care in labor and delivery, antepartum 08/23/2020  . Genetic carrier 04/16/2020  . Supervision of other normal pregnancy, antepartum 03/29/2020  . 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 Asthma     Assessment / Plan: 19 y.o. G1P0 at [redacted]w[redacted]d here for elective IOL.  Labor: S/p Fb. Pitocin at 10 mu/min. Progressing well. AROM with clear fluid at 1330.  Fetal Wellbeing:  Cat I-II. Overall reassuring.  Pain Control:  Epidural in place  Anticipated MOD:  NSVD  Marcy Siren, D.O. OB Fellow  08/23/2020, 1:59 PM

## 2020-08-23 NOTE — Discharge Summary (Signed)
Postpartum Discharge Summary    Patient Name: Beverly Mckenzie DOB: 2001/01/31 MRN: [id number]  Date of admission: 08/22/2020 Delivery date:08/23/2020  Delivering provider: Nicolette Bang  Date of discharge: 08/25/2020  Admitting diagnosis: Indication for care in labor and delivery, antepartum [O75.9] Intrauterine pregnancy: [redacted]w[redacted]d     Secondary diagnosis:  Principal Problem:   Vaginal delivery Active Problems:   Supervision of other normal pregnancy, antepartum   Asthma   Genetic carrier   Indication for care in labor and delivery, antepartum   Type 3a perineal laceration  Additional problems: as noted above  Discharge diagnosis: Term Pregnancy Delivered                                              Post partum procedures:Nexplanon Augmentation: AROM, Pitocin and OP Foley Complications: None  Hospital course: Induction of Labor With Vaginal Delivery   19 y.o. yo G1P0 at [redacted]w[redacted]d was admitted to the hospital 08/22/2020 for induction of labor.  Indication for induction: Unstable lie.  Patient had an uncomplicated labor course as follows: Membrane Rupture Time/Date: 1:44 PM ,08/23/2020   Delivery Method:Vaginal, Spontaneous  Episiotomy: None  Lacerations:    Details of delivery can be found in separate delivery note.  Patient had a routine postpartum course. Patient is discharged home 08/25/20.  Newborn Data: Birth date:08/23/2020  Birth time:4:39 PM  Gender:Female  Living status:Living  Apgars:9 ,9  Weight:3300 g   Magnesium Sulfate received: No BMZ received: No Rhophylac:N/A MMR:N/A T-DaP:Given prenatally Flu: Yes Transfusion:Yes  Physical exam  Vitals:   08/24/20 1100 08/24/20 1327 08/24/20 2303 08/25/20 0621  BP: 107/71 108/69 114/72 (!) 98/56  Pulse: 92 96 88 85  Resp:  $Remo'18 18 16  'ysxoK$ Temp: 98.6 F (37 C) 98.2 F (36.8 C) 98.4 F (36.9 C) 98.6 F (37 C)  TempSrc: Oral Oral Oral Oral  SpO2: 100% 100% 100% 100%  Weight:      Height:       General:  alert, cooperative and no distress Lochia: appropriate Uterine Fundus: firm Incision: N/A DVT Evaluation: No evidence of DVT seen on physical exam. No cords or calf tenderness. No significant calf/ankle edema. Labs: Lab Results  Component Value Date   WBC 21.9 (H) 08/24/2020   HGB 8.7 (L) 08/24/2020   HCT 26.7 (L) 08/24/2020   MCV 84.2 08/24/2020   PLT 221 08/24/2020   CMP Latest Ref Rng & Units 06/27/2020  Glucose 70 - 99 mg/dL 81  BUN 6 - 20 mg/dL 6  Creatinine 0.44 - 1.00 mg/dL 0.44  Sodium 135 - 145 mmol/L 136  Potassium 3.5 - 5.1 mmol/L 3.7  Chloride 98 - 111 mmol/L 105  CO2 22 - 32 mmol/L 22  Calcium 8.9 - 10.3 mg/dL 9.2  Total Protein 6.5 - 8.1 g/dL 6.1(L)  Total Bilirubin 0.3 - 1.2 mg/dL 0.6  Alkaline Phos 38 - 126 U/L 76  AST 15 - 41 U/L 19  ALT 0 - 44 U/L 17   Edinburgh Score: Edinburgh Postnatal Depression Scale Screening Tool 08/25/2020  I have been able to laugh and see the funny side of things. 0  I have looked forward with enjoyment to things. 0  I have blamed myself unnecessarily when things went wrong. 2  I have been anxious or worried for no good reason. 2  I have felt scared or panicky for no good  reason. 1  Things have been getting on top of me. 0  I have been so unhappy that I have had difficulty sleeping. 0  I have felt sad or miserable. 0  I have been so unhappy that I have been crying. 0  The thought of harming myself has occurred to me. 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   coconut oil Oil Apply 1 application topically as needed (nipple pain).   ibuprofen 600 MG tablet Commonly known as: ADVIL Take 1 tablet (600 mg total) by mouth every 8 (eight) hours as needed for moderate pain or cramping.   polyethylene glycol 17 g packet Commonly known as: MIRALAX / GLYCOLAX Take 17 g by mouth daily as needed.   prenatal multivitamin Tabs tablet Take 1 tablet by mouth daily at 12 noon.        Discharge home in stable condition Infant Feeding: Breast Infant Disposition:home with mother Discharge instruction: per After Visit Summary and Postpartum booklet. Activity: Advance as tolerated. Pelvic rest for 6 weeks.  Diet: routine diet Future Appointments: Future Appointments  Date Time Provider [place]  09/23/2020  8:15 AM Clarnce Flock, MD [facility]   Follow up Visit: Please schedule this patient for a In person postpartum visit in 4 weeks with the following provider: Any provider. Additional Postpartum F/U: laceration check 1 week Low risk pregnancy complicated by: 3a perineal laceration, asthma Delivery mode:  Vaginal, Spontaneous  Anticipated Birth Control:  PP Nexplanon placed  Laury Deep, CNM 08/25/2020 10:00 AM

## 2020-08-23 NOTE — Lactation Note (Signed)
This note was copied from a baby's chart.  LC reached out to RN, Marti Sleigh, to see if this was a good time to see Mom in L and D. RN states Mom is in the middle of a repair and to call back in 30 minutes.

## 2020-08-23 NOTE — Anesthesia Procedure Notes (Signed)
Epidural Patient location during procedure: OB Start time: 08/23/2020 9:14 AM End time: 08/23/2020 9:17 AM  Staffing Anesthesiologist: Kaylyn Layer, MD Performed: anesthesiologist   Preanesthetic Checklist Completed: patient identified, IV checked, risks and benefits discussed, monitors and equipment checked, pre-op evaluation and timeout performed  Epidural Patient position: sitting Prep: DuraPrep and site prepped and draped Patient monitoring: continuous pulse ox, blood pressure and heart rate Approach: midline Location: L3-L4 Injection technique: LOR air  Needle:  Needle type: Tuohy  Needle gauge: 17 G Needle length: 9 cm Needle insertion depth: 5 cm Catheter type: closed end flexible Catheter size: 19 Gauge Catheter at skin depth: 10 cm Test dose: negative and Other (1% lidocaine)  Assessment Events: blood not aspirated, injection not painful, no injection resistance, no paresthesia and negative IV test  Additional Notes Patient identified. Risks, benefits, and alternatives discussed with patient including but not limited to bleeding, infection, nerve damage, paralysis, failed block, incomplete pain control, headache, blood pressure changes, nausea, vomiting, reactions to medication, itching, and postpartum back pain. Confirmed with bedside nurse the patient's most recent platelet count. Confirmed with patient that they are not currently taking any anticoagulation, have any bleeding history, or any family history of bleeding disorders. Patient expressed understanding and wished to proceed. All questions were answered. Sterile technique was used throughout the entire procedure. Please see nursing notes for vital signs.   Crisp LOR on first pass. Test dose was given through epidural catheter and negative prior to continuing to dose epidural or start infusion. Warning signs of high block given to the patient including shortness of breath, tingling/numbness in hands, complete  motor block, or any concerning symptoms with instructions to call for help. Patient was given instructions on fall risk and not to get out of bed. All questions and concerns addressed with instructions to call with any issues or inadequate analgesia.  Reason for block:procedure for pain

## 2020-08-23 NOTE — Lactation Note (Signed)
This note was copied from a baby's chart. Lactation Consultation Note  Patient Name: Beverly Mckenzie Date: 08/23/2020 Reason for consult: Mother's request;Difficult latch;Primapara;1st time breastfeeding;Term;L&D Initial assessment Age:18 hours  Infant is 40 weeks 1 hour old. Mom states she used her pump for the last 2 weeks but no colostrum noted. Mom asked for assistance with latching. LC showed Mom how to do hand expression, no colostrum noted. Mom has areolae edema flattening her nipples. Nipple role prior to latching, infant licking at the breast. But not able to latch.   Talked to Mom about feeding based on cues 8-12x in 24 hour period no more than 4 hours without an attempt.  Additional LC support will be provided on the floor by [facility] or RN.

## 2020-08-23 NOTE — Anesthesia Preprocedure Evaluation (Signed)
Anesthesia Evaluation  Patient identified by MRN, date of birth, ID band Patient awake    Reviewed: Allergy & Precautions, Patient's Chart, lab work & pertinent test results  History of Anesthesia Complications Negative for: history of anesthetic complications  Airway Mallampati: II  TM Distance: >3 FB Neck ROM: Full    Dental no notable dental hx.    Pulmonary asthma ,    Pulmonary exam normal        Cardiovascular negative cardio ROS Normal cardiovascular exam     Neuro/Psych Depression negative neurological ROS     GI/Hepatic negative GI ROS, Neg liver ROS,   Endo/Other  negative endocrine ROS  Renal/GU negative Renal ROS  negative genitourinary   Musculoskeletal negative musculoskeletal ROS (+)   Abdominal   Peds  Hematology negative hematology ROS (+)   Anesthesia Other Findings Day of surgery medications reviewed with patient.  Reproductive/Obstetrics (+) Pregnancy                             Anesthesia Physical Anesthesia Plan  ASA: II  Anesthesia Plan: Epidural   Post-op Pain Management:    Induction:   PONV Risk Score and Plan: Treatment may vary due to age or medical condition  Airway Management Planned: Natural Airway  Additional Equipment:   Intra-op Plan:   Post-operative Plan:   Informed Consent: I have reviewed the patients History and Physical, chart, labs and discussed the procedure including the risks, benefits and alternatives for the proposed anesthesia with the patient or authorized representative who has indicated his/her understanding and acceptance.       Plan Discussed with:   Anesthesia Plan Comments:         Anesthesia Quick Evaluation

## 2020-08-24 LAB — CBC
HCT: 26.7 % — ABNORMAL LOW (ref 36.0–46.0)
Hemoglobin: 8.7 g/dL — ABNORMAL LOW (ref 12.0–15.0)
MCH: 27.4 pg (ref 26.0–34.0)
MCHC: 32.6 g/dL (ref 30.0–36.0)
MCV: 84.2 fL (ref 80.0–100.0)
Platelets: 221 10*3/uL (ref 150–400)
RBC: 3.17 MIL/uL — ABNORMAL LOW (ref 3.87–5.11)
RDW: 15 % (ref 11.5–15.5)
WBC: 21.9 10*3/uL — ABNORMAL HIGH (ref 4.0–10.5)
nRBC: 0 % (ref 0.0–0.2)

## 2020-08-24 MED ORDER — SODIUM CHLORIDE 0.9 % IV SOLN
500.0000 mg | Freq: Once | INTRAVENOUS | Status: AC
  Administered 2020-08-24: 500 mg via INTRAVENOUS
  Filled 2020-08-24: qty 25

## 2020-08-24 NOTE — Anesthesia Postprocedure Evaluation (Signed)
Anesthesia Post Note  Patient: Games developer  Procedure(s) Performed: AN AD HOC LABOR EPIDURAL     Patient location during evaluation: Mother Baby Anesthesia Type: Epidural Level of consciousness: awake and alert Pain management: pain level controlled Vital Signs Assessment: post-procedure vital signs reviewed and stable Respiratory status: spontaneous breathing, nonlabored ventilation and respiratory function stable Cardiovascular status: stable Postop Assessment: no headache, no backache and epidural receding Anesthetic complications: no   No complications documented.  Last Vitals:  Vitals:   08/24/20 0201 08/24/20 0607  BP: 103/70 107/71  Pulse: 80 82  Resp: 15   Temp: 36.9 C 36.8 C  SpO2: 100% 100%    Last Pain:  Vitals:   08/24/20 0607  TempSrc: Oral  PainSc:    Pain Goal: Patients Stated Pain Goal: 9 (08/23/20 0738)                 Fanny Dance

## 2020-08-24 NOTE — Progress Notes (Signed)
Post Partum Day 1 Subjective:  Patient is doing well without complaints. Ambulating without difficulty. Voiding and passing flatus. Tolerating PO. Abdominal pain improved. Vaginal bleeding decreased.  Objective: Blood pressure 103/70, pulse 80, temperature 98.4 F (36.9 C), temperature source Oral, resp. rate 15, height 5' 7.5" (1.715 m), weight 83.5 kg, last menstrual period 11/17/2019, SpO2 100 %, unknown if currently breastfeeding.  Physical Exam:  General: alert, cooperative and no distress Lochia: appropriate Uterine Fundus: firm Incision: n/a DVT Evaluation: No evidence of DVT seen on physical exam.  Recent Labs    08/22/20 2359  HGB 10.6*  HCT 31.7*    Assessment/Plan: PPD#1  -meeting pp milestones  -VSS  -desires nexplanon prior to discharge for birth control  -desires circ for baby, consented  -breastfeeding, doing well  #3A laceration/EBL 750cc  -post deliv cbc pending  -doing well from pain standpoint  Plan for discharge tomorrow given time of delivery and complications.   LOS: 1 day   Alric Seton 08/24/2020, 5:43 AM

## 2020-08-24 NOTE — Clinical Social Work Maternal (Signed)
CLINICAL SOCIAL WORK MATERNAL/CHILD NOTE  Patient Details  Name: Beverly Mckenzie MRN: [id number] Date of Birth: [birth year]  Date:  08/24/2020  Clinical Social Worker Initiating Note:  Hortencia Pilar, LCSW Date/Time: Initiated:  08/24/20/0920     Child's Name:  Ivan Croft   Biological Parents:  Mother,Father (Beverly Mckenzie)   Need for Interpreter:  None   Reason for Referral:  Behavioral Health Concerns   Address:  [address] [place] [address]    Phone number:  [phone] (home)     Additional phone number: none   Household Members/Support Persons (HM/SP):   Household Member/Support Person 2   HM/SP Name Relationship DOB or Age  HM/SP -1  Beverly Mckenzie  MOB 11-19-2000  HM/SP -2 Beverly Mckenzie  MOB's grandmother  68 years old   HM/SP -3        HM/SP -4        HM/SP -5        HM/SP -6        HM/SP -7        HM/SP -8          Natural Supports (not living in the home):  Parent,Immediate Family   Professional Supports: None   Employment: Consulting civil engineer   Type of Work: MOB reports that she attends [employer]   Education:  Attending college   Homebound arranged:  na  Financial Resources:  Medicaid   Other Resources:  [facility]   Cultural/Religious Considerations Which May Impact Care:  none reported.   Strengths:    all items to care for infant, Pediatrician chosen, compliance with medical plan.   Psychotropic Medications:     None reported.    Pediatrician:     [facility]   Pediatrician List:   [facility]      Pediatrician Fax Number:    Risk Factors/Current Problems:  None   Cognitive State:  Alert ,Able to Concentrate ,Insightful    Mood/Affect:  Relaxed ,Interested ,Comfortable ,Calm ,Happy    CSW Assessment: CSW consulted due to MOB having a hx of depression and Bipolar.   CSW congratulated MOB on the  birth of infant. MOB was advised of the HIPPA policy in which CSW asked MOB's mother to leave room for privacy. MOB and MGM both agreeable. CSW then advised MOB of CSW's role and the reason for CSW coming to speak with her. MOB expressed that she has a hx of depression and Bipolar. MOB expressed that she was diagnosed 09/24/19. MOB reported no exact reason/event occuring that led to her mental health hx. MOB expressed that she was on Latuda prior to her pregnancy, however MOB reported that she ceased medication use one pregnancy was confirmed. MOB expressed that she has plans to restart the medication since she has given birth. MOB advised CSW that her medication works well for her and that during her pregnancy she has little issues with her mental health. MOB reported no other mental health hx to CSW and denies SI, HI and DV to this CSW.  CSW inquired from [facility] on who her supports are. MOB indicated that she lives with her grandmother has her mother lives in [place]. MOB reported that her mom is her support as well as "shes here visiting with her". MOB reported that FOB will be involved with the care of infant as well. MOB  expressed to CSW that she is current;y in school at [employer] . MOB advised CSW that she has all needed items to care for infant with plans for infant to sleep in basinet once arrived home.   CSW took time to provide MOB with PPD and SIDS education. MOB was given PPD Checklist in order to keep track of her feelings as they may relate to PPD. MOB thanked CSW and expressed no other needs to CSW.   CSW Plan/Description:  No Further Intervention Required/No Barriers to Discharge,Perinatal Mood and Anxiety Disorder (PMADs) Education,Neonatal Abstinence Syndrome (NAS) Education    Neliah Cuyler S Reginold Beale, LCSWA 08/24/2020, 9:30 AM  

## 2020-08-25 ENCOUNTER — Encounter (HOSPITAL_COMMUNITY): Payer: Self-pay | Admitting: Family Medicine

## 2020-08-25 ENCOUNTER — Encounter: Payer: Medicaid Other | Admitting: Family Medicine

## 2020-08-25 DIAGNOSIS — Z30017 Encounter for initial prescription of implantable subdermal contraceptive: Secondary | ICD-10-CM

## 2020-08-25 MED ORDER — COCONUT OIL OIL: 1.0000 "application " | TOPICAL_OIL | 0 refills | Status: DC | PRN

## 2020-08-25 MED ORDER — ACETAMINOPHEN 325 MG PO TABS: 650.0000 mg | ORAL_TABLET | Freq: Four times a day (QID) | ORAL | Status: DC | PRN

## 2020-08-25 MED ORDER — IBUPROFEN 600 MG PO TABS
600.0000 mg | ORAL_TABLET | Freq: Three times a day (TID) | ORAL | 0 refills | Status: DC | PRN
Start: 2020-08-25 — End: 2021-05-05

## 2020-08-25 MED ORDER — ETONOGESTREL 68 MG ~~LOC~~ IMPL
68.0000 mg | DRUG_IMPLANT | Freq: Once | SUBCUTANEOUS | Status: AC
  Administered 2020-08-25: 68 mg via SUBCUTANEOUS
  Filled 2020-08-25: qty 1

## 2020-08-25 MED ORDER — POLYETHYLENE GLYCOL 3350 17 G PO PACK: 17.0000 g | PACK | Freq: Every day | ORAL | 0 refills | Status: DC | PRN

## 2020-08-25 MED ORDER — LIDOCAINE HCL 1 % IJ SOLN
0.0000 mL | Freq: Once | INTRAMUSCULAR | Status: AC | PRN
  Administered 2020-08-25: 3 mL via INTRADERMAL
  Filled 2020-08-25: qty 20

## 2020-08-25 NOTE — Procedures (Deleted)
Procedure: Newborn Female Circumcision using a Gomco  Indication: Parental request, HIV prophylaxis Z29.8  EBL: Minimal  Complications: None immediate  Anesthesia: 1% lidocaine local, oral sucrose, Tylenol    Parent desires circumcision for her female infant.  Circumcision procedure details, risks, and benefits discussed, and written informed consent obtained. Risks/benefits include but are not limited to: benefits of circumcision in men include reduction in the rates of urinary tract infection (UTI), penile cancer, some sexually transmitted infections, penile inflammatory and retractile disorders, as well as easier hygiene; risks include bleeding, infection, injury of glans which may lead to penile deformity or urinary tract issues, unsatisfactory cosmetic appearance, and other potential complications related to the procedure.  It was emphasized that this is an elective procedure.    Procedure in detail:  A dorsal penile nerve block was performed with 1% lidocaine.  The area was then cleaned with betadine and draped in sterile fashion.  Two hemostats are applied at the 3 o'clock and 9 o'clock positions on the foreskin.  While maintaining traction, a third hemostat was used to sweep around the glans the release adhesions between the glans and the inner layer of mucosa avoiding the 5 o'clock and 7 o'clock positions.   The hemostat is then placed at the 12 o'clock position in the midline.  The hemostat is then removed and scissors are used to cut along the crushed skin to its most proximal point.   The foreskin is retracted over the glans removing any additional adhesions with blunt dissection or probe as needed.  The foreskin is then placed back over the glans and the  1.3 cm gomco bell is inserted over the glans.  The two hemostats are removed and one hemostat holds the foreskin and underlying mucosa.  The incision is guided above the base plate of the gomco.  The clamp is then attached and tightened until  the foreskin is crushed between the bell and the base plate.  This is held in place for 5 minutes with excision of the foreskin atop the base plate with the scalpel.  The thumbscrew is then loosened, base plate removed and then bell removed with gentle traction.  The area was inspected and found to be hemostatic.  A 6.5 inch of gelfoam was then applied to the cut edge of the foreskin.     Jen Mow, DO [facility] for [employer], [facility] 08/25/2020 9:57 AM

## 2020-08-25 NOTE — Procedures (Signed)
PROCEDURE NOTE   [name] is a 19 y.o. G1P1001 here for Nexplanon insertion. No complaints. PPD #2  BP (!) 98/56 (BP Location: Left Arm)   Pulse 85   Temp 98.6 F (37 C) (Oral)   Resp 16   Ht 5' 7.5" (1.715 m)   Wt 83.5 kg   LMP 11/17/2019   SpO2 100%   Breastfeeding Unknown   BMI 28.39 kg/m    No results found for this or any previous visit (from the past 24 hour(s)).    Nexplanon Insertion Procedure Patient identified, informed consent performed, consent signed.   Patient does understand that irregular bleeding is a very common side effect of this medication. She was advised to have backup contraception for one week after placement. Pregnancy test in clinic today was negative.  Appropriate time out taken.  Patient's left arm was prepped and draped in the usual sterile fashion. The ruler used to measure and mark insertion area.  Patient was prepped with alcohol swab and then injected with 3 ml of 1% lidocaine.  She was prepped with betadine, Nexplanon removed from packaging,  Device confirmed in needle, then inserted full length of needle and withdrawn per handbook instructions. Nexplanon was able to palpated in the patient's arm; patient palpated the insert herself. There was minimal blood loss.  Patient insertion site covered with guaze and a pressure bandage to reduce any bruising.  The patient tolerated the procedure well and was given post procedure instructions. Follow-up via My Chart video visit , unless having problems and need to be seen in the office.  Nexplanon Lot#: [id number] / Expiration Date: 05/23/2022  Raelyn Mora, CNM  08/25/2020 10:10 AM

## 2020-08-25 NOTE — Lactation Note (Signed)
This note was copied from a baby's chart. Lactation Consultation Note Baby 66 hrs old. Mom hopes to be d/c home today. Mom stated BF going well. Mom isn't using NS anymore d/t baby is latching to breast w/o difficulty or pain. Mom is supplementing w/formula.  Reviewed engorgement, milk storage, management, importance of I&O, support groups, WIC, when to call Dr. And breast care.  Mom states she has no further questions. Mom states she feels good about going home w/baby.  Patient Name: Beverly Mckenzie Date: 08/25/2020 Reason for consult: Follow-up assessment;Primapara;Term Age:49 hours  Maternal Data    Feeding    LATCH Score                   Interventions    Lactation Tools Discussed/Used     Consult Status Consult Status: Complete Date: 08/25/20 Follow-up type: In-patient    Santiago Graf, Diamond Nickel 08/25/2020, 5:22 AM

## 2020-09-23 ENCOUNTER — Encounter: Payer: Self-pay | Admitting: Family Medicine

## 2020-09-23 ENCOUNTER — Other Ambulatory Visit: Payer: Self-pay

## 2020-09-23 ENCOUNTER — Ambulatory Visit (INDEPENDENT_AMBULATORY_CARE_PROVIDER_SITE_OTHER): Payer: Medicaid Other | Admitting: Family Medicine

## 2020-09-23 DIAGNOSIS — Z1389 Encounter for screening for other disorder: Secondary | ICD-10-CM

## 2020-09-23 NOTE — Progress Notes (Signed)
[place] Partum Visit Note  Beverly Mckenzie is a 20 y.o. G58P1001 female who presents for a postpartum visit. She is 4 weeks postpartum following a normal spontaneous vaginal delivery.  I have fully reviewed the prenatal and intrapartum course. The delivery was at 40 gestational weeks.  Anesthesia: epidural. Postpartum course has been uneventful. Baby is doing well. Baby is feeding by breast. Bleeding no bleeding. Bowel function is normal. Bladder function is normal. Patient is not sexually active. Contraception method is Nexplanon. Postpartum depression screening: negative.     Edinburgh Postnatal Depression Scale - 09/23/20 0837      Edinburgh Postnatal Depression Scale:  In the Past 7 Days   I have been able to laugh and see the funny side of things. 0    I have looked forward with enjoyment to things. 0    I have blamed myself unnecessarily when things went wrong. 0    I have been anxious or worried for no good reason. 2    I have felt scared or panicky for no good reason. 2    Things have been getting on top of me. 0    I have been so unhappy that I have had difficulty sleeping. 0    I have felt sad or miserable. 0    I have been so unhappy that I have been crying. 0    The thought of harming myself has occurred to me. 0    Edinburgh Postnatal Depression Scale Total 4            The following portions of the patient's history were reviewed and updated as appropriate: allergies, current medications, past family history, past medical history, past social history, past surgical history and problem list.  Review of Systems Pertinent items noted in HPI and remainder of comprehensive ROS otherwise negative.    Objective:  LMP 11/17/2019  BP 118/81   Pulse 94   Wt 143 lb (64.9 kg)   LMP 11/17/2019   BMI 22.07 kg/m    General:  alert, cooperative and appears stated age   Breasts:  not examined  Lungs: comfortable on room air  Heart:  not examined  Abdomen: soft, nontender    Vulva:  normal, well healed laceration with small amount of loose monocryl that was removed        Assessment:    Normal postpartum exam. Pap smear not done at today's visit.   Plan:   Essential components of care per ACOG recommendations:  1.  Mood and well being: Patient with negative depression screening today. Reviewed local resources for support.  - Patient does not use tobacco.  - hx of drug use? No   2. Infant care and feeding:  -Patient currently breastmilk feeding? Yes Reviewed importance of draining breast regularly to support lactation. -Social determinants of health (SDOH) reviewed in EPIC. No concerns  3. Sexuality, contraception and birth spacing - Patient does not want a pregnancy in the next year.  Desired family size is 1 children.  - Reviewed forms of contraception in tiered fashion. Patient desired nexplanon , placed at hospital   4. Sleep and fatigue -Encouraged family/partner/community support of 4 hrs of uninterrupted sleep to help with mood and fatigue  5. Physical Recovery  - Discussed patients delivery and complications - Patient had a 3a degree laceration, perineal healing reviewed. Patient expressed understanding - Patient has urinary incontinence? No - Patient is safe to resume physical and sexual activity  6.  Health Maintenance -  not yet due for cancer screening   Clarnce Flock, MD [facility] for [facility], [place]

## 2020-09-23 NOTE — Patient Instructions (Signed)

## 2020-10-25 ENCOUNTER — Telehealth: Payer: Self-pay | Admitting: *Deleted

## 2020-10-25 MED ORDER — NORGESTIMATE-ETH ESTRADIOL 0.25-35 MG-MCG PO TABS: ORAL_TABLET | ORAL | 0 refills | Status: DC

## 2020-10-25 NOTE — Telephone Encounter (Signed)
Called pt to discuss her Mychart message regarding abnormal post partum bleeding. Pt has been bleeding for nine weeks. She changes a pad every 3 hours during the Beverly Mckenzie - minimal bleeding @ night. She also has some clots and cramping. Consult w/Dr. Dione Plover who recommends pelvic/trans vag ultrasound and Ortho cyclen x2 months. Pt was advised of plan of care. She voiced understanding of all information and instructions given.

## 2020-11-02 ENCOUNTER — Ambulatory Visit: Admission: RE | Admit: 2020-11-02 | Payer: Medicaid Other | Source: Ambulatory Visit

## 2020-11-28 ENCOUNTER — Encounter: Payer: Self-pay | Admitting: General Practice

## 2020-12-19 ENCOUNTER — Ambulatory Visit: Payer: Medicaid Other | Admitting: Family Medicine

## 2021-02-20 ENCOUNTER — Ambulatory Visit (INDEPENDENT_AMBULATORY_CARE_PROVIDER_SITE_OTHER): Payer: Medicaid Other | Admitting: Student

## 2021-02-20 ENCOUNTER — Encounter: Payer: Self-pay | Admitting: Student

## 2021-02-20 ENCOUNTER — Other Ambulatory Visit: Payer: Self-pay

## 2021-02-20 VITALS — BP 101/77 | HR 75 | Wt 144.5 lb

## 2021-02-20 DIAGNOSIS — Z3046 Encounter for surveillance of implantable subdermal contraceptive: Secondary | ICD-10-CM

## 2021-02-20 DIAGNOSIS — Z30013 Encounter for initial prescription of injectable contraceptive: Secondary | ICD-10-CM

## 2021-02-20 MED ORDER — MEDROXYPROGESTERONE ACETATE 150 MG/ML IM SUSP
150.0000 mg | Freq: Once | INTRAMUSCULAR | Status: AC
  Administered 2021-02-20: 150 mg via INTRAMUSCULAR

## 2021-02-20 NOTE — Progress Notes (Signed)
Patient is here for Nexplanon contraception removal. Nexplanon was inserted in December of 2021 and stated that she wants it removed because of her having 2 periods in 1 month along with losing weight and rapid mood changes  Crayton Savarese, CMA   02/20/21 8:50am

## 2021-02-20 NOTE — Progress Notes (Signed)
  History:  [name] is a 20 y.o. G1P1001 who presents to clinic today for Nexplanon removal. Feeling like her moods are up and down; also reports weight loss and ha. No changes in diet, stress level. She is working and taking care of her baby. She feels overall well. She is not taking any medication. She does not want any STI testing today.   The following portions of the patient's history were reviewed and updated as appropriate: allergies, current medications, family history, past medical history, social history, past surgical history and problem list.  Review of Systems:  Review of Systems  Constitutional: Negative.   HENT: Negative.    Gastrointestinal: Negative.   Genitourinary: Negative.   Skin: Negative.   Neurological: Negative.      Objective:  Physical Exam BP 101/77   Pulse 75   Wt 144 lb 8 oz (65.5 kg)   LMP 02/06/2021 (Exact Date)   Breastfeeding No   BMI 22.30 kg/m  Physical Exam Constitutional:      Appearance: Normal appearance.  HENT:     Head: Normocephalic.  Genitourinary:    General: Normal vulva.  Skin:    General: Skin is warm.  Neurological:     Mental Status: She is alert.      Labs and Imaging No results found for this or any previous visit (from the past 24 hour(s)).  No results found.  Health Maintenance Due  Topic Date Due   HPV VACCINES (1 - 2-dose series) Never done   COVID-19 Vaccine (3 - Booster for Pfizer series) 05/22/2020     Assessment & Plan:   1. Encounter for Nexplanon removal   2. Encounter for initial prescription of injectable contraceptive    -Nexplanon removed without difficulty, see removal note below -Started on Depo shot today  Approximately 20 minutes of total time was spent with this patient on care and counseling.   Starr Lake, CNM 02/20/2021 8:59 AM      GYNECOLOGY OFFICE PROCEDURE NOTE  Beverly Mckenzie is a 20 y.o. G1P1001 here for Nexplanon removal.  Last pap smear  was on NA due to age.  No other gynecologic concerns.  Nexplanon Removal Patient identified, informed consent performed, consent signed.   Appropriate time out taken. Nexplanon site identified.  Area prepped in usual sterile fashon. One ml of 1% lidocaine was used to anesthetize the area at the distal end of the implant. A small stab incision was made right beside the implant on the distal portion.  The Nexplanon rod was grasped using hemostats and removed without difficulty.  There was minimal blood loss. There were no complications.  3 ml of 1% lidocaine was injected around the incision for post-procedure analgesia.  Steri-strips were applied over the small incision.  A pressure bandage was applied to reduce any bruising.  The patient tolerated the procedure well and was given post procedure instructions.  Patient is planning to use Depo for contraception/attempt conception.   [facility] [employer], [place]

## 2021-05-05 ENCOUNTER — Encounter (HOSPITAL_COMMUNITY): Payer: Self-pay

## 2021-05-05 ENCOUNTER — Ambulatory Visit (HOSPITAL_COMMUNITY)
Admission: RE | Admit: 2021-05-05 | Discharge: 2021-05-05 | Disposition: A | Discharge status: Home / Self Care | Payer: Medicaid Other | Attending: Psychiatry | Admitting: Psychiatry

## 2021-05-05 ENCOUNTER — Emergency Department (HOSPITAL_COMMUNITY)
Admission: EM | Admit: 2021-05-05 | Discharge: 2021-05-06 | Disposition: A | Discharge status: Home / Self Care | Payer: Medicaid Other | Attending: Emergency Medicine | Admitting: Emergency Medicine

## 2021-05-05 DIAGNOSIS — S61512A Laceration without foreign body of left wrist, initial encounter: Secondary | ICD-10-CM | POA: Diagnosis not present

## 2021-05-05 DIAGNOSIS — T44992A Poisoning by other drug primarily affecting the autonomic nervous system, intentional self-harm, initial encounter: Secondary | ICD-10-CM | POA: Insufficient documentation

## 2021-05-05 DIAGNOSIS — F329 Major depressive disorder, single episode, unspecified: Secondary | ICD-10-CM | POA: Insufficient documentation

## 2021-05-05 DIAGNOSIS — Z79899 Other long term (current) drug therapy: Secondary | ICD-10-CM | POA: Insufficient documentation

## 2021-05-05 DIAGNOSIS — Z20822 Contact with and (suspected) exposure to covid-19: Secondary | ICD-10-CM | POA: Diagnosis not present

## 2021-05-05 DIAGNOSIS — J45909 Unspecified asthma, uncomplicated: Secondary | ICD-10-CM | POA: Insufficient documentation

## 2021-05-05 DIAGNOSIS — T1491XA Suicide attempt, initial encounter: Secondary | ICD-10-CM

## 2021-05-05 DIAGNOSIS — X789XXA Intentional self-harm by unspecified sharp object, initial encounter: Secondary | ICD-10-CM | POA: Diagnosis not present

## 2021-05-05 LAB — CBC WITH DIFFERENTIAL/PLATELET
Abs Immature Granulocytes: 0.01 10*3/uL (ref 0.00–0.07)
Basophils Absolute: 0 10*3/uL (ref 0.0–0.1)
Basophils Relative: 1 %
Eosinophils Absolute: 0.2 10*3/uL (ref 0.0–0.5)
Eosinophils Relative: 3 %
HCT: 38.4 % (ref 36.0–46.0)
Hemoglobin: 12.7 g/dL (ref 12.0–15.0)
Immature Granulocytes: 0 %
Lymphocytes Relative: 27 %
Lymphs Abs: 1.6 10*3/uL (ref 0.7–4.0)
MCH: 29.3 pg (ref 26.0–34.0)
MCHC: 33.1 g/dL (ref 30.0–36.0)
MCV: 88.7 fL (ref 80.0–100.0)
Monocytes Absolute: 0.4 10*3/uL (ref 0.1–1.0)
Monocytes Relative: 7 %
Neutro Abs: 3.5 10*3/uL (ref 1.7–7.7)
Neutrophils Relative %: 62 %
Platelets: 349 10*3/uL (ref 150–400)
RBC: 4.33 MIL/uL (ref 3.87–5.11)
RDW: 13.3 % (ref 11.5–15.5)
WBC: 5.8 10*3/uL (ref 4.0–10.5)
nRBC: 0 % (ref 0.0–0.2)

## 2021-05-05 LAB — RAPID URINE DRUG SCREEN, HOSP PERFORMED
Amphetamines: NOT DETECTED
Barbiturates: NOT DETECTED
Benzodiazepines: NOT DETECTED
Cocaine: NOT DETECTED
Opiates: NOT DETECTED
Tetrahydrocannabinol: NOT DETECTED

## 2021-05-05 LAB — COMPREHENSIVE METABOLIC PANEL
ALT: 12 U/L (ref 0–44)
AST: 17 U/L (ref 15–41)
Albumin: 4 g/dL (ref 3.5–5.0)
Alkaline Phosphatase: 61 U/L (ref 38–126)
Anion gap: 9 (ref 5–15)
BUN: 7 mg/dL (ref 6–20)
CO2: 23 mmol/L (ref 22–32)
Calcium: 9.6 mg/dL (ref 8.9–10.3)
Chloride: 104 mmol/L (ref 98–111)
Creatinine, Ser: 0.78 mg/dL (ref 0.44–1.00)
GFR, Estimated: >60 mL/min (ref >60)
Glucose, Bld: 83 mg/dL (ref 70–99)
Potassium: 4 mmol/L (ref 3.5–5.1)
Sodium: 136 mmol/L (ref 135–145)
Total Bilirubin: 0.6 mg/dL (ref 0.3–1.2)
Total Protein: 7.5 g/dL (ref 6.5–8.1)

## 2021-05-05 LAB — SALICYLATE LEVEL: Salicylate Lvl: <7 mg/dL — ABNORMAL LOW (ref 7.0–30.0)

## 2021-05-05 LAB — ACETAMINOPHEN LEVEL: Acetaminophen (Tylenol), Serum: <10 ug/mL — ABNORMAL LOW (ref 10–30)

## 2021-05-05 LAB — ETHANOL: Alcohol, Ethyl (B): <10 mg/dL (ref <10)

## 2021-05-05 MED ORDER — LORAZEPAM 1 MG PO TABS: 1.0000 mg | ORAL_TABLET | ORAL | Status: DC | PRN

## 2021-05-05 NOTE — ED Provider Notes (Signed)
[facility]ERGENCY DEPARTMENT Provider Note   CSN: [id number] Arrival date & time: 05/05/21  1631     History Chief Complaint  Patient presents with   Ingestion    Beverly Mckenzie is a 20 y.o. female with PMH of bipolar disorder not on any medication, presenting to ED after attempting to cut her wrists with superficial lacerations and taking 5-6 sudafed at 10 AM today with a goal of committing suicide. She was feeling hopeless due to relationship issues. She is 8 months postpartum. Her 8 month son was at home when this all occurred. She experienced nausea, vomiting, and headache, which lasted 1-2 hours; currently she is asymptomatic and reports feeling "good." She is cooperative and no longer expresses any suicidal ideations.     Ingestion Took 5-6 pills of maximum strength sudafed. Contacted poison control, with recommendations to observe for tachycardia for up to 8 hours, EKG, and tylenol levels. No therapy needed. Vitals stable, no tachycardia.      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   Indication for care in labor and delivery, antepartum 08/23/2020   Genetic carrier 04/16/2020   Supervision of other normal pregnancy, antepartum 03/29/2020   Fibroid, uterine 03/29/2020   Asthma     Past Surgical History:  Procedure Laterality Date   NO PAST SURGERIES     PR CIRCUMCISION,OTHR,NEWBORN  08/25/2020         OB History     Gravida  1   Para  1   Term  1   Preterm      AB      Living  1      SAB      IAB      Ectopic      Multiple  0   Live Births  1           Family History  Problem Relation Age of Onset   Healthy Mother    Healthy Father     Social History   Tobacco Use   Smoking status: Never   Smokeless tobacco: Never  Vaping Use   Vaping  Use: Never used  Substance Use Topics   Alcohol use: No   Drug use: No    Home Medications Prior to Admission medications   Medication Sig Start Date End Date Taking? Authorizing Provider  acetaminophen (TYLENOL) 325 MG tablet Take 2 tablets (650 mg total) by mouth every 6 (six) hours as needed for mild pain, moderate pain, fever or headache. Patient not taking: Reported on 02/20/2021 08/25/20   Laury Deep, CNM  coconut oil OIL Apply 1 application topically as needed (nipple pain). Patient not taking: Reported on 02/20/2021 08/25/20   Laury Deep, CNM  ibuprofen (ADVIL) 600 MG tablet Take 1 tablet (600 mg total) by mouth every 8 (eight) hours as needed for moderate pain or cramping. Patient not taking: Reported on 02/20/2021 08/25/20   Laury Deep, CNM  norgestimate-ethinyl estradiol (ORTHO-CYCLEN) 0.25-35 MG-MCG tablet Take one tablet daily of hormone pills only. Skip the placebo pills Patient not taking: Reported on 02/20/2021 10/25/20   Clarnce Flock, MD  polyethylene glycol (MIRALAX / GLYCOLAX) 17 g packet Take 17 g by mouth daily as needed. Patient not taking: No sig reported 08/25/20   Laury Deep,  CNM  Prenatal Vit-Fe Fumarate-FA (PRENATAL MULTIVITAMIN) TABS tablet Take 1 tablet by mouth daily at 12 noon. Patient not taking: Reported on 02/20/2021    [provider]    Allergies    Lenon Ahmadi (malabar nut tree) [justicia adhatoda], Cat hair extract, and Dog epithelium allergy skin test  Review of Systems   Review of Systems  All other systems reviewed and are negative.   Physical Exam Updated Vital Signs There were no vitals taken for this visit.  Physical Exam Constitutional:      Appearance: Normal appearance.  HENT:     Head: Normocephalic and atraumatic.  Eyes:     Extraocular Movements: Extraocular movements intact.     Conjunctiva/sclera: Conjunctivae normal.  Cardiovascular:     Rate and Rhythm: Normal rate and regular rhythm.      Pulses: Normal pulses.     Heart sounds: Normal heart sounds.  Pulmonary:     Effort: Pulmonary effort is normal.     Breath sounds: Normal breath sounds.  Abdominal:     General: Abdomen is flat. Bowel sounds are normal.     Palpations: Abdomen is soft.  Musculoskeletal:     Cervical back: Normal range of motion and neck supple.     Right lower leg: No edema.     Left lower leg: No edema.  Skin:    General: Skin is warm and dry.  Neurological:     General: No focal deficit present.     Mental Status: She is alert and oriented to person, place, and time. Mental status is at baseline.  Psychiatric:        Mood and Affect: Mood normal.        Behavior: Behavior normal.        Thought Content: Thought content normal.        Judgment: Judgment normal.    ED Results / Procedures / Treatments   Labs (all labs ordered are listed, but only abnormal results are displayed) Labs Reviewed  CBC WITH DIFFERENTIAL/PLATELET  COMPREHENSIVE METABOLIC PANEL  ETHANOL  SALICYLATE LEVEL  ACETAMINOPHEN LEVEL  RAPID URINE DRUG SCREEN, HOSP PERFORMED  I-STAT BETA HCG BLOOD, ED ([facility], [facility], [facility] ONLY)    EKG None  Radiology No results found.  Procedures Procedures   Medications Ordered in ED Medications - No data to display  ED Course  I have reviewed the triage vital signs and the nursing notes.  Pertinent labs & imaging results that were available during my care of the patient were reviewed by me and considered in my medical decision making (see chart for details).  Clinical Course as of 05/05/21 2102  Fri May 05, 2021  1910 Acetaminophen (Tylenol), S(!): <10 [MP]    Clinical Course User Index [MP] Lajean Manes, MD   MDM Rules/Calculators/A&P                           Pt presenting after left wrist superficial lacerations and taking excess sudafed. Pt initially had nausea and headaches, but remains asymptomatic with stable labs. EKG sinus rhythm, acetaminophen and salicylate  levels wnl, and normal CBC and CMP. Pt monitored and is now outside of 8 hour window; she remained stable, with no tachycardia. Pt is medically cleared for psych evaluation, psych eval pending.    Final Clinical Impression(s) / ED Diagnoses Final diagnoses:  None    Rx / DC Orders ED Discharge Orders     None  Lajean Manes, MD 05/05/21 2102    Drenda Freeze, MD 05/05/21 2352

## 2021-05-05 NOTE — ED Notes (Signed)
Dinner tray ordered for pt

## 2021-05-05 NOTE — ED Provider Notes (Signed)
   3:16 PM Pt being sent from [facility] for possible ingestion of pseudoephedrine, about 8-10 tablets, unknown dose, at noon today. Admits to nausea, vomiting, and abdominal pain. Pt being sent for medical clearance. Will arrive via EMS shortly.    Campbell Stall P, DO [phone]

## 2021-05-05 NOTE — BH Assessment (Addendum)
Patient was seen as a walk in at [facility] this date with ongoing S/I. Patient reported she ingested medications prior to arrival and was immediately sent to [facility] for medical clearance. A full TTS assessment was not completed at that time due to concerns over the medications she took prior to arrival. TTS assessment will be completed once she is medically cleared and more information can be obtained.  This Probation officer spoke with [place] charge nurse and informed to put TTS consult in once patient was medically stable.

## 2021-05-05 NOTE — H&P (Signed)
Behavioral Health Medical Screening Exam  Beverly Mckenzie is an 20 y.o. female who presented to [facility] voluntarily with the mother for assessment of depression.   Patient reports aunt sent police over her house for wellness check after she sent out a "good bye" text message. She reports ingesting 7-8 Sudafed in an attempt to end her life. She reports she has vomited since the ingestion with some abdominal pain.   Assessment stopped; EMS contacted for transport to ED for medical clearance. Patient provided verbal consent to notify her mother who was waiting in lobby. Patient's mother Bethine Hermoso) made aware.  Total Time spent with patient: 15 minutes  Psychiatric Specialty Exam: Physical Exam Vitals and nursing note reviewed.  Constitutional:      Appearance: She is normal weight.  HENT:     Head: Normocephalic.     Nose: Nose normal.     Mouth/Throat:     Mouth: Mucous membranes are moist.     Pharynx: Oropharynx is clear.  Eyes:     Pupils: Pupils are equal, round, and reactive to light.  Cardiovascular:     Rate and Rhythm: Normal rate.     Pulses: Normal pulses.  Pulmonary:     Effort: Pulmonary effort is normal.  Abdominal:     General: Abdomen is flat.  Musculoskeletal:        General: Normal range of motion.     Cervical back: Normal range of motion.  Skin:    General: Skin is warm and dry.  Neurological:     General: No focal deficit present.     Mental Status: She is alert.  Psychiatric:        Attention and Perception: Attention and perception normal.        Mood and Affect: Mood is depressed. Affect is flat and tearful.        Speech: Speech normal.        Behavior: Behavior is withdrawn.        Thought Content: Thought content includes suicidal ideation. Thought content does not include homicidal ideation. Thought content does not include homicidal plan.        Cognition and Memory: Cognition and memory normal.        Judgment: Judgment is impulsive and  inappropriate.   Review of Systems  Psychiatric/Behavioral:  Positive for self-injury and suicidal ideas.   All other systems reviewed and are negative. Blood pressure 109/75, pulse 78, temperature 98 F (36.7 C), temperature source Oral, resp. rate 16, SpO2 100 %, not currently breastfeeding.There is no height or weight on file to calculate BMI. General Appearance: Casual and Disheveled Eye Contact:  Fair Speech:  Clear and Coherent Volume:  Decreased Mood:  Depressed, Dysphoric, and Hopeless Affect:  Depressed and Flat Thought Process:  Coherent Orientation:  Full (Time, Place, and Person) Thought Content:  Logical Suicidal Thoughts:  Yes.  with intent/plan Homicidal Thoughts:  No Memory:  NA Judgement:  Poor Insight:  Present and Shallow Psychomotor Activity:  Normal Concentration: Concentration: Fair and Attention Span: Fair Recall:  [employer] of Knowledge:Fair Language: Fair Akathisia:  NA Handed:  Right AIMS (if indicated):    Assets:  Desire for Improvement Financial Resources/Insurance Housing Physical Health Resilience Social Support Vocational/Educational Sleep:     Musculoskeletal: Strength & Muscle Tone: within normal limits Gait & Station: normal Patient leans: N/A  Blood pressure 109/75, pulse 78, temperature 98 F (36.7 C), temperature source Oral, resp. rate 16, SpO2 100 %, not currently breastfeeding.  Recommendations: Based on my evaluation the patient appears to have an emergency medical condition for which I recommend the patient be transferred to the emergency department for further evaluation.   Inda Merlin, NP 05/05/2021, 3:07 PM

## 2021-05-05 NOTE — ED Triage Notes (Signed)
Pt comes from [facility] was diverted from [facility]. Pt was at [facility] today with mom after attempting to cut wrists, superficial laceration on left arm. Pt then took approx 7 sudafed around 1000 today. Vitals are stable. Pt a/o x4.  BP 112/78 HR 78 RR 18 O2 98% RA CBG 113 Temp 100.7

## 2021-05-06 LAB — SARS CORONAVIRUS 2 (TAT 6-24 HRS): SARS Coronavirus 2: NEGATIVE

## 2021-05-06 LAB — PREGNANCY, URINE: Preg Test, Ur: NEGATIVE

## 2021-05-06 NOTE — ED Provider Notes (Signed)
I spoke to [place] from behavioral health who is now contacting [facility] to confirm patient will not be transferred to their facility.   Wyvonnia Dusky, MD 05/06/21 2157

## 2021-05-06 NOTE — Progress Notes (Signed)
Per [name], pt has been accepted to [facility] [address]. Accepting provider is Dr. Merrie Roof. Patient can arrive anytime. Number for report is [phone].  [name], MSW, LCSW-A Phone: [phone] Disposition/TOC

## 2021-05-06 NOTE — ED Notes (Signed)
Plan for TTS discussed with pt. Pt given additional warm blanket and cup of ice water.

## 2021-05-06 NOTE — BH Assessment (Signed)
Clinician received a message fromCaitlin Watlington, RN: " someone else is being ttsed on the machine, Darylene Price will be her RN now. I'm moving to a hallway bed."   Clinician asked RN, if it was for the pt in room 53, if so that was me Donnald Garre already assess that pt.   Per RN, "no, someone else took it after that."   Clinician noted the pt has another nurse. TTS to check back.    Vertell Novak, [place], [facility], [facility] Triage Specialist [phone]
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
## 2021-05-06 NOTE — Progress Notes (Signed)
Per Olena Leatherwood, patient meets criteria for inpatient treatment. There are no available or appropriate beds at [facility] today. CSW faxed referrals to the following facilities for review:  [facility]  Pending - No Request Sent N/A [address], [place] [address] [phone] --  [place] N/A [address] [place] [address] [phone] --  [facility]  Pending - No Request Sent N/A [address] [place] [address] [phone] --  [place]  Pending - No Request Sent N/A [address] [place] [address] [phone] --  [facility]  Pending - No Request Sent N/A [address] [place], [place] [address] [phone] --  [facility]  Pending - No Request Sent N/A [address] [place] [address] [place] --  [facility]  Pending - No Request Sent N/A [address] [place] [address] [phone] --  [facility]  Pending - No Request Sent N/A [address] [place] [address] [phone] --  [facility]  Pending - No Request Sent N/A [address], [place] [address] [id number] --  [facility]  Pending - No Request Sent N/A [place] [place] [address] [phone] --  [place]  Pending - No Request Sent N/A [address], [place] [address] [phone] --  [facility]  Pending - No Request Sent N/A [place], [place] [address] [place] --  [place]  Pending - No Request Sent N/A [place] [place] [address] [phone] --  [facility]   Pending - No Request Sent N/A [address], [place] [address] [id number] --  [facility]  Pending - No Request Sent N/A [address] [place] [address] [id number] --  [facility]  Pending - No Request Sent N/A [place], [place] [address] [phone] --  [place] No Request Sent N/A [place] [address] [phone] --   TTS will continue to seek bed placement.  [name], MSW, [place], LCAS-A Phone: [phone] Disposition/TOC

## 2021-05-06 NOTE — BH Assessment (Signed)
Clinician messaged Darylene Price, RN, "Hey. It's Trey with TTS. Is Dymonique Gillen able to be assessed, If so she will need to be placed in a private room."    Clinician awaiting response.    Vertell Novak, [place], [facility], [facility] Triage Specialist [phone]

## 2021-05-06 NOTE — Discharge Instructions (Addendum)
It is extremely important you follow-up with a therapist and behavioral health specialist.  I provided you the phone number and address for behavioral health center.  If you have or feel that you are having a mental crisis, they are available 24 hours a day for walk-ins or phone calls.  You can also call to set up an appointment with a new psychiatrist or therapist.

## 2021-05-06 NOTE — ED Provider Notes (Signed)
I had a long discussion with the patient and her mother regarding her plan of care.  The patient's mother who is a former Education officer, museum in the mental health field feels that the patient would have a safe environment for home discharge, and is concerned that inpatient psychiatric hospitalization would be quite upsetting to the patient.  The patient's mother lives with the patient and is helping her raise her 32-monthold son, in addition to additional family members.  She feels the patient is safe for home discharge, and reports they have already set up an appointment with a psychiatrist.  Patient herself is calm and able to express appropriate regret for her actions, and also demonstrate insight into the dangers of her actions.  She is not actively suicidal.  She reports she does not want to die.  She reports she felt she was under stress, but does feel that she can manage this at home in the company of her mother.  She cares for her child, does not express any attention now (nor according to her mother has she ever) expressed intentions of harming her child.  CPS has visited the house already and deemed appropriate for the child to remain with the family.  Given his clinical context, the fact the patient is medically and I believe psychiatrically stable, I do think it is reasonable for her to follow-up as an outpatient.  I rescinded the IVC.  I provided a behavioral outpatient follow-up.  They both verbalized understanding.  Return precautions were given.  She will go home in the company of her mother and family.   TWyvonnia Dusky MD 05/06/21 2155

## 2021-05-06 NOTE — BH Assessment (Signed)
Clinician message Lyndel Pleasure, RN: "Hey. it's Trey with TTS. Is the pt able to engage in TTS assessment, also is the pt under IVC."   RN to find cart.    Vertell Novak, [place], [facility], [facility] Triage Specialist [phone]
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
## 2021-05-06 NOTE — ED Notes (Signed)
Pt made aware that she is under IVC

## 2021-05-06 NOTE — ED Notes (Signed)
TTS in progress 

## 2021-05-06 NOTE — ED Provider Notes (Signed)
Emergency Medicine Observation Re-evaluation Note  Beverly Mckenzie is a 20 y.o. female, seen on rounds today.  Pt initially presented to the ED for complaints of Ingestion Patient with history of depression, bipolar disorder, presenting for suicidal ideation with reported suicide attempt yesterday.  Attempt was by ingestion (Sudafed).  Currently, the patient is under IVC.  Physical Exam  BP 115/70   Pulse 87   Resp 18   SpO2 100%  Physical Exam General: Awake, alert, nondistressed Cardiac: Normal rate and rhythm Lungs: Breathing is even and unlabored Psych: Tearful, no agitation, no evidence of psychosis  ED Course / MDM  EKG:EKG Interpretation  Date/Time:  Friday May 05 2021 20:01:10 EDT Ventricular Rate:  89 PR Interval:  151 QRS Duration: 75 QT Interval:  343 QTC Calculation: 418 R Axis:   69 Text Interpretation: Sinus rhythm Multiple ventricular premature complexes Nonspecific T abnormalities, anterior leads Confirmed by Orpah Greek [phone]) on 05/06/2021 4:28:03 AM  I have reviewed the labs performed to date as well as medications administered while in observation.  Recent changes in the last 24 hours include: Patient arrived in the ED and IVC has been placed.  Psychiatry has evaluated and will recommend inpatient psychiatric admission.  Patient states that she does not take any home medications regularly.  Patient was offered food but did not want to eat.  She is concerned that admission to [facility] may worsen her anxiety.  Plan  Current plan is for inpatient psychiatric admission.  Beverly Mckenzie is under involuntary commitment.     Godfrey Pick, MD 05/06/21 [phone]

## 2021-05-06 NOTE — ED Notes (Signed)
Patient given discharge instructions, all questions answered. Patient in possession of all belongings, directed to the discharge area  

## 2021-05-06 NOTE — ED Notes (Signed)
MD at bedside speaking with patient and her mother per patient request

## 2021-05-06 NOTE — ED Notes (Signed)
Patient requesting to speak with doctor. At the desk speaking with mother on phone.

## 2021-07-13 ENCOUNTER — Ambulatory Visit: Payer: Medicaid Other | Admitting: Emergency Medicine

## 2021-07-14 ENCOUNTER — Ambulatory Visit: Payer: Medicaid Other | Admitting: Medical

## 2021-08-03 ENCOUNTER — Ambulatory Visit: Payer: Medicaid Other | Admitting: Nurse Practitioner

## 2021-08-23 ENCOUNTER — Ambulatory Visit: Payer: Medicaid Other | Admitting: Obstetrics & Gynecology

## 2021-08-27 NOTE — L&D Delivery Note (Addendum)
Delivery Note At 7:05 PM a viable female was delivered via Vaginal, Spontaneous (Presentation: Right Occiput Anterior).  APGAR: 8, 9; weight  .   Placenta status: Spontaneous, Intact.  Cord: 3 vessels with the following complications: None.  Cord pH:  Anesthesia: Epidural Episiotomy: None Lacerations: 2nd degree Suture Repair: 2.0 and 3.0 Vicryl Est. Blood Loss (mL):  1058m  Mom to postpartum.  Baby to Couplet care / Skin to Skin.  MSalvadore Oxford MD, PGY-1 CDundee8:21 PM 04/13/2022     OB/GYN Faculty Practice Delivery Note  Beverly Clareyis a 21y.o. [id number] NSVD at 361w0dShe was admitted for eIOL at 39 weeks.   ROM: 4h 3312mth clear fluid GBS Status: Neg    Labor Progress:   Delivery Date/Time: 04/13/2022 at 1906 Delivery: Called to room and patient was complete and pushing. Head delivered OA. No nuchal cord present. Shoulder and body delivered in usual fashion. Infant with spontaneous cry, placed on mother's abdomen, dried and stimulated. Cord clamped x 2 after 1-minute delay, and cut by MGM. Cord blood drawn. Placenta delivered spontaneously with gentle cord traction. Fundus firm with massage and Pitocin. Labia, perineum, vagina, and cervix inspected.  Placenta: normal , to L and D  Complications: Patient had BRB at delivery of placenta along with large clot expelled prior to delivery of placenta. She had LUS atony which improved with manual sweep of lower uterine segment, fundal massage, TXA and methergine as well as pitocin per protocol.  Patient had wide 2nd degree tear which was repaired with assistance from Dr. OzaNelda Marseilleing 2-0 and 3-0 vicryl. Laceration was still oozing so vaginal packing was left in place for 1 hour.  Lacerations: 2nd degree EBL: 1000 Analgesia: epidural  Postpartum Planning [x message to sent to schedule follow-up    Infant:   APGARs 8  9  weight 3487 g  [name]

## 2021-09-01 ENCOUNTER — Ambulatory Visit: Payer: Medicaid Other | Admitting: Medical

## 2021-09-09 ENCOUNTER — Other Ambulatory Visit: Payer: Self-pay

## 2021-09-09 ENCOUNTER — Ambulatory Visit (HOSPITAL_COMMUNITY)
Admission: EM | Admit: 2021-09-09 | Discharge: 2021-09-09 | Disposition: A | Discharge status: Home / Self Care | Payer: Medicaid Other | Attending: Family Medicine | Admitting: Family Medicine

## 2021-09-09 ENCOUNTER — Encounter (HOSPITAL_COMMUNITY): Payer: Self-pay | Admitting: *Deleted

## 2021-09-09 DIAGNOSIS — Z3201 Encounter for pregnancy test, result positive: Secondary | ICD-10-CM | POA: Diagnosis not present

## 2021-09-09 LAB — POC URINE PREG, ED: Preg Test, Ur: POSITIVE — AB

## 2021-09-09 NOTE — ED Provider Notes (Signed)
[facility]    CSN: [id number] Arrival date & time: 09/09/21  1535      History   Chief Complaint Chief Complaint  Patient presents with   Possible Pregnancy    HPI Marnesha Gagen is a 21 y.o. female presenting for pregnancy test. LMP 07/14/21. Has been trying to conceive.  Feeling well, denies abdominal pain, vaginal bleeding or spotting, urinary symptoms.  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 Authorizing Provider  ibuprofen (ADVIL) 200 MG tablet Take 800 mg by mouth daily as needed for headache or cramping (pain).    [provider]  medroxyPROGESTERone (DEPO-PROVERA) 150 MG/ML injection Inject 150 mg into the muscle every 3 (three) months.    [provider]    Family History Family History  Problem Relation Age of Onset   Healthy Mother    Healthy Father     Social History Social History   Tobacco Use   Smoking status: Never   Smokeless tobacco: Never  Vaping Use   Vaping Use: Never used  Substance Use Topics   Alcohol use: No   Drug use: No     Allergies   Justicia adhatoda (malabar nut tree) [justicia adhatoda], Cat hair extract, and Dog  epithelium allergy skin test   Review of Systems Review of Systems  All other systems reviewed and are negative.   Physical Exam Triage Vital Signs ED Triage Vitals  Enc Vitals Group     BP 09/09/21 1657 105/62     Pulse Rate 09/09/21 1657 92     Resp 09/09/21 1657 18     Temp 09/09/21 1657 98.6 F (37 C)     Temp src --      SpO2 09/09/21 1657 96 %     Weight --      Height --      Head Circumference --      Peak Flow --      Pain Score 09/09/21 1653 0     Pain Loc --      Pain Edu? --      Excl. in [place]? --    No data found.  Updated Vital Signs BP 105/62    Pulse 92    Temp 98.6 F (37 C)    Resp 18    LMP 07/14/2021  SpO2 96%   Visual Acuity Right Eye Distance:   Left Eye Distance:   Bilateral Distance:    Right Eye Near:   Left Eye Near:    Bilateral Near:     Physical Exam Vitals reviewed.  Constitutional:      General: She is not in acute distress.    Appearance: Normal appearance. She is not ill-appearing.  HENT:     Head: Normocephalic and atraumatic.     Mouth/Throat:     Mouth: Mucous membranes are moist.     Comments: Moist mucous membranes Eyes:     Extraocular Movements: Extraocular movements intact.     Pupils: Pupils are equal, round, and reactive to light.  Cardiovascular:     Rate and Rhythm: Normal rate and regular rhythm.     Heart sounds: Normal heart sounds.  Pulmonary:     Effort: Pulmonary effort is normal.     Breath sounds: Normal breath sounds. No wheezing, rhonchi or rales.  Abdominal:     General: Bowel sounds are normal. There is no distension.     Palpations: Abdomen is soft. There is no mass.     Tenderness: There is no abdominal tenderness. There is no right CVA tenderness, left CVA tenderness, guarding or rebound.  Skin:    General: Skin is warm.     Capillary Refill: Capillary refill takes less than 2 seconds.     Comments: Good skin turgor  Neurological:     General: No focal deficit present.     Mental  Status: She is alert and oriented to person, place, and time.  Psychiatric:        Mood and Affect: Mood normal.        Behavior: Behavior normal.     UC Treatments / Results  Labs (all labs ordered are listed, but only abnormal results are displayed) Labs Reviewed  POC URINE PREG, ED - Abnormal; Notable for the following components:      Result Value   Preg Test, Ur POSITIVE (*)    All other components within normal limits    EKG   Radiology No results found.  Procedures Procedures (including critical care time)  Medications Ordered in UC Medications - No data to display  Initial Impression / Assessment and Plan / UC Course  I have reviewed the triage vital signs and the nursing notes.  Pertinent labs & imaging results that were available during my care of the patient were reviewed by me and considered in my medical decision making (see chart for details).     This patient is a very pleasant 21 y.o. year old female presenting with positive pregnancy test. LMP 07/14/21.   U-preg positive.   She already has a gyn.  ED return precautions discussed. Patient verbalizes understanding and agreement.    Final Clinical Impressions(s) / UC Diagnoses   Final diagnoses:  Positive pregnancy test     Discharge Instructions      -You're about [redacted] weeks pregnant.  -Establish care with OB-GYN, information below.      ED Prescriptions   None    PDMP not reviewed this encounter.   Hazel Sams, PA-C 09/09/21 1718

## 2021-09-09 NOTE — Discharge Instructions (Addendum)
-  You're about [redacted] weeks pregnant.  -Establish care with OB-GYN, information below.

## 2021-09-09 NOTE — ED Triage Notes (Signed)
Pt requesting a pregnancy test

## 2021-09-12 ENCOUNTER — Ambulatory Visit: Payer: Medicaid Other | Admitting: Registered Nurse

## 2021-09-14 ENCOUNTER — Ambulatory Visit: Payer: Medicaid Other

## 2021-09-16 IMAGING — US US OB < 14 WEEKS - US OB TV
1 series · 15 of 28 positions shown · non-contrast
Comparison: None.

CLINICAL DATA: Initial evaluation for acute abdominal pain, early
pregnancy.

EXAM:
OBSTETRIC <14 WK US AND TRANSVAGINAL OB US
TECHNIQUE: Both transabdominal and transvaginal ultrasound examinations were
performed for complete evaluation of the gestation as well as the
maternal uterus, adnexal regions, and pelvic cul-de-sac.
Transvaginal technique was performed to assess early pregnancy.

[Series 1: us ob < 14 weeks - us ob tv · 15 of 68 slices shown]
[im 1/68]
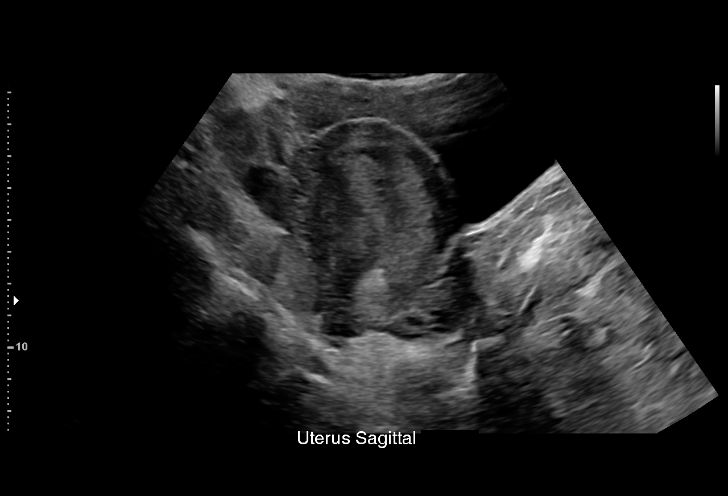
[im 5/68]
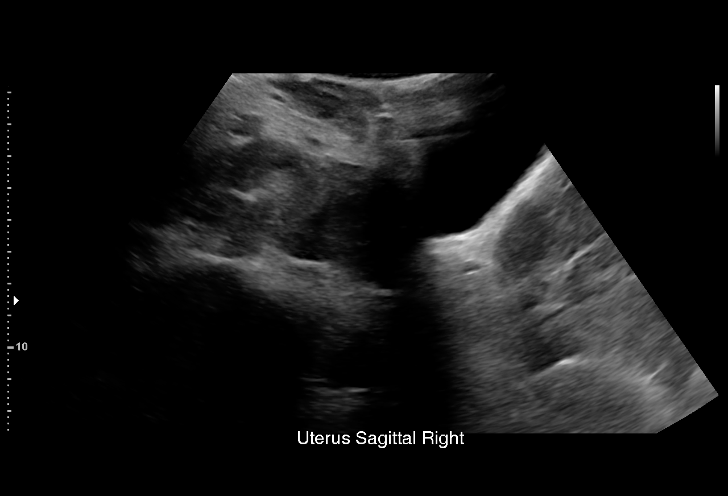
[im 10/68]
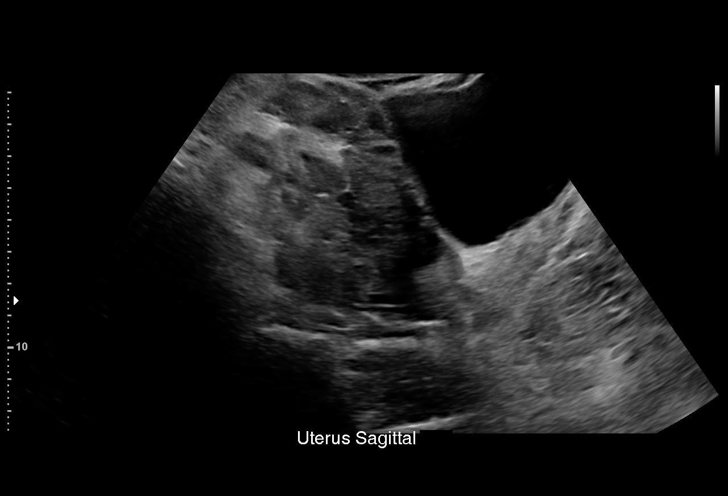
[im 15/68]
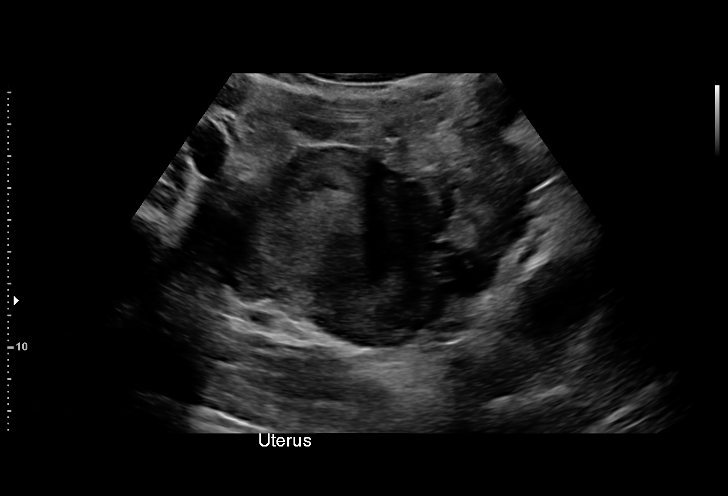
[im 20/68]
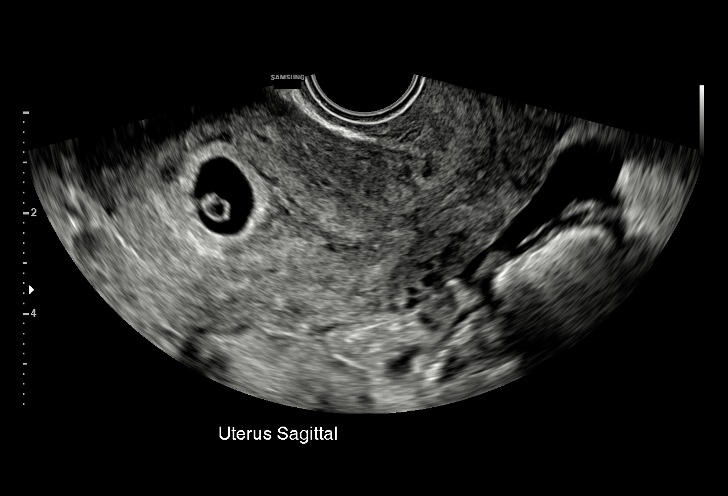
[im 25/68]
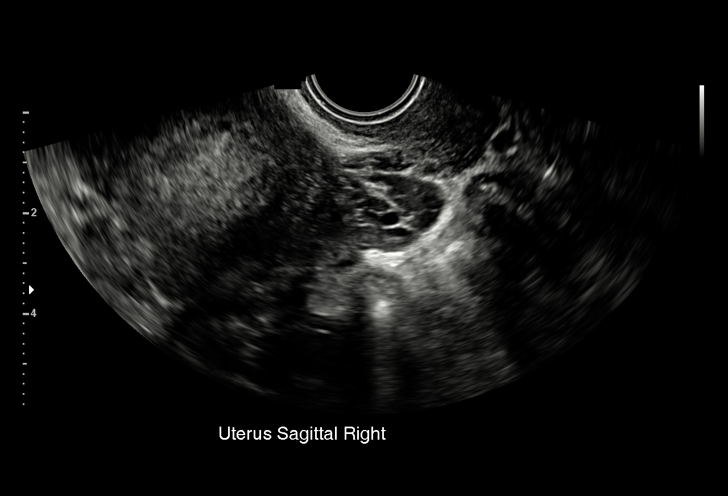
[im 30/68]
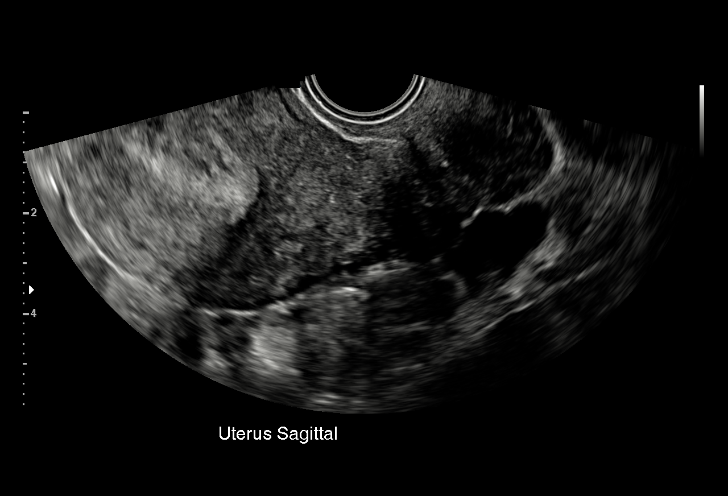
[im 35/68]
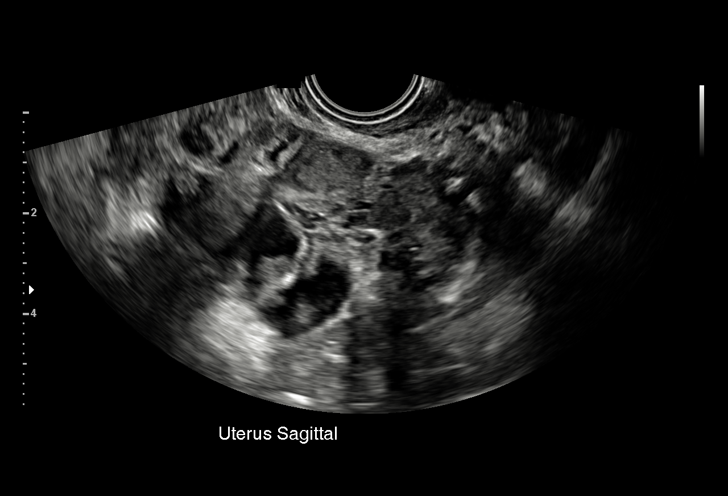
[im 38/68]
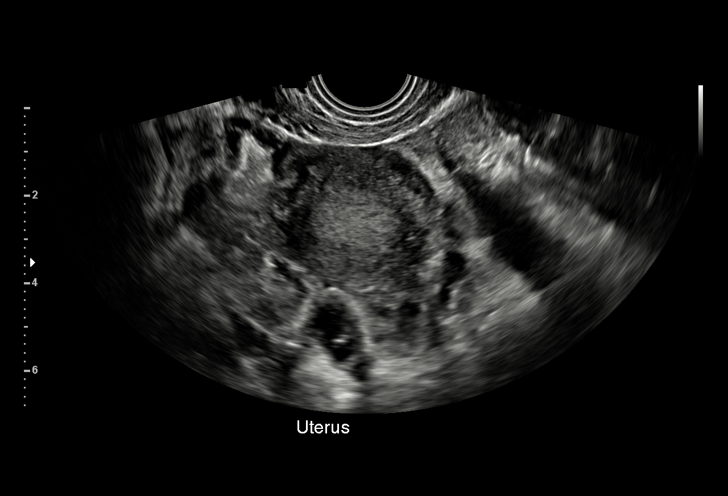
[im 43/68]
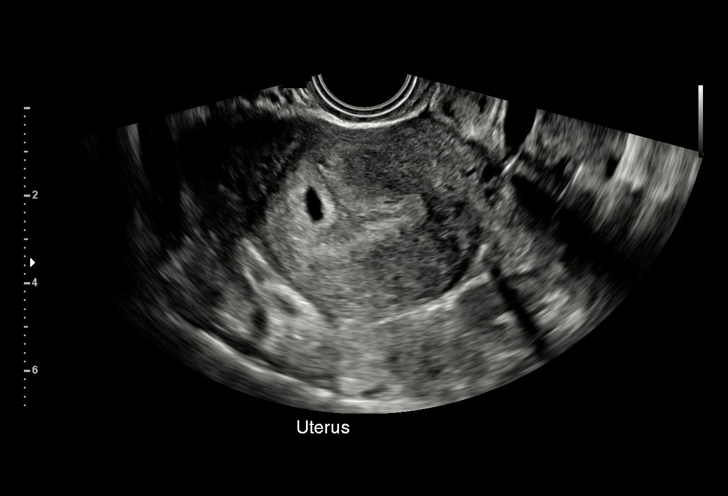
[im 48/68]
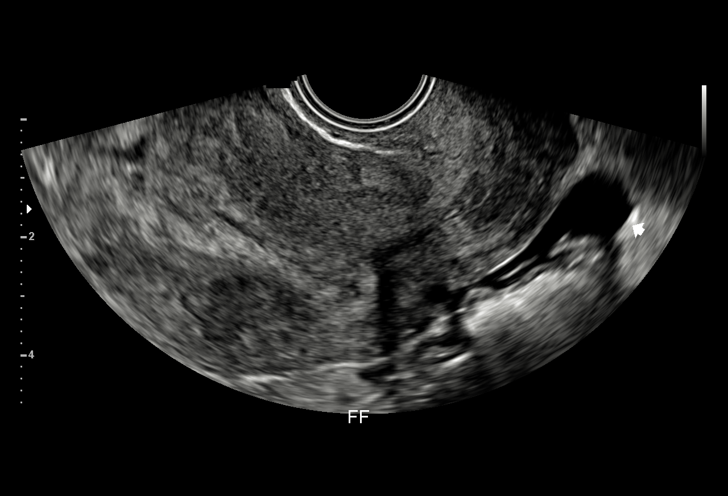
[im 53/68]
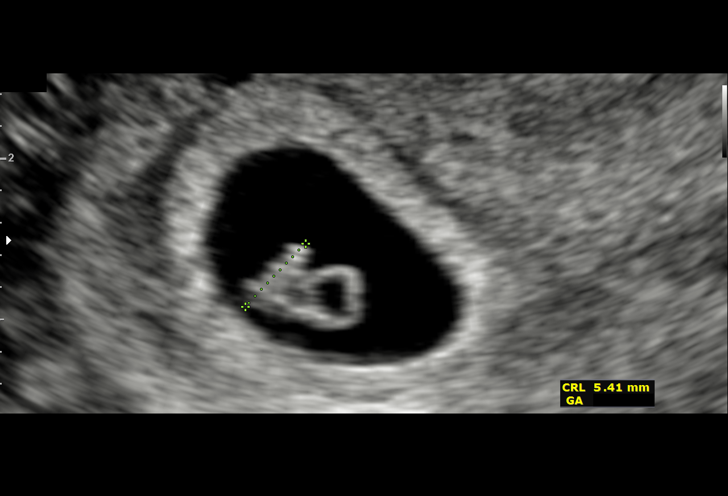
[im 58/68]
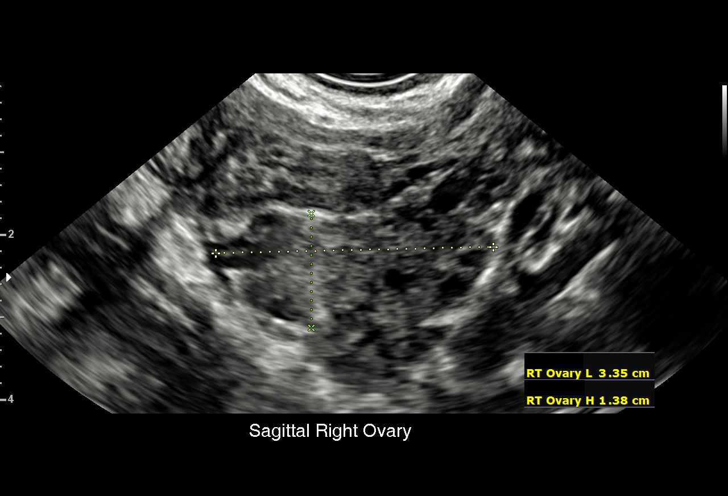
[im 63/68]
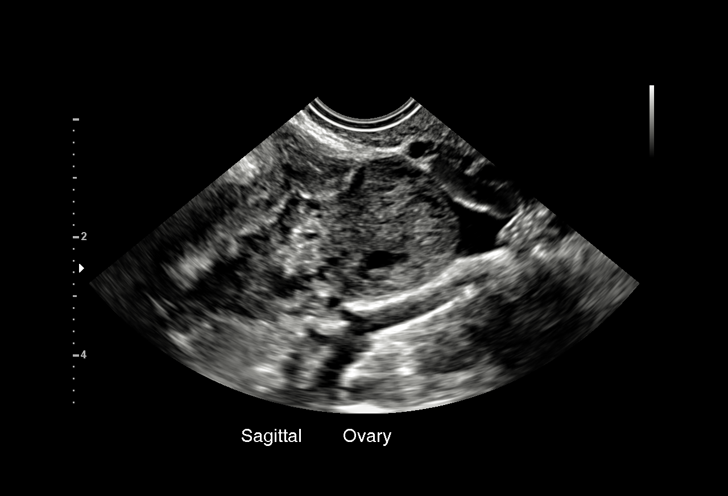
[im 68/68]
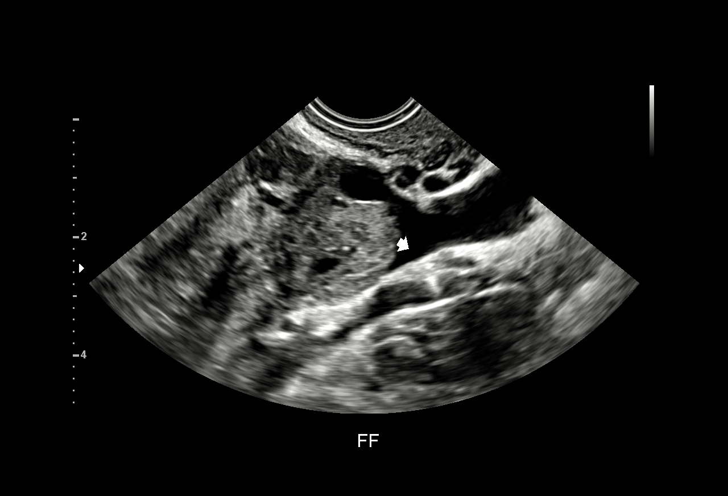

[15 of 28 positions shown; findings below may reference images not displayed]

FINDINGS: Intrauterine gestational sac: Single

Yolk sac:  Present

Embryo:  Present

Cardiac Activity: Present

Heart Rate: 141 bpm

CRL: 5.4 mm   6 w   1 d                  US EDC: 08/22/2020

Subchorionic hemorrhage:  None visualized.

Maternal uterus/adnexae: Ovaries are normal in appearance
bilaterally. Degenerating corpus luteal cyst noted on the left.
Associated small volume free fluid within the pelvis.
IMPRESSION: 1. Single viable intrauterine pregnancy as above, estimated
gestational age 6 weeks and 1 day by crown-rump length, with
ultrasound EDC of 08/22/2020. No complication.
2. Degenerating left ovarian corpus luteal cyst with associated
small volume free fluid within the pelvis.

## 2021-09-19 ENCOUNTER — Telehealth (INDEPENDENT_AMBULATORY_CARE_PROVIDER_SITE_OTHER): Payer: Medicaid Other

## 2021-09-19 ENCOUNTER — Inpatient Hospital Stay (HOSPITAL_COMMUNITY)
Admission: AD | Admit: 2021-09-19 | Discharge: 2021-09-19 | Discharge status: Against Medical Advice | Payer: Medicaid Other | Attending: Family Medicine | Admitting: Family Medicine

## 2021-09-19 ENCOUNTER — Other Ambulatory Visit: Payer: Self-pay

## 2021-09-19 DIAGNOSIS — Z5321 Procedure and treatment not carried out due to patient leaving prior to being seen by health care provider: Secondary | ICD-10-CM | POA: Diagnosis present

## 2021-09-19 DIAGNOSIS — Z3A00 Weeks of gestation of pregnancy not specified: Secondary | ICD-10-CM

## 2021-09-19 DIAGNOSIS — Z3480 Encounter for supervision of other normal pregnancy, unspecified trimester: Secondary | ICD-10-CM

## 2021-09-19 NOTE — Progress Notes (Signed)
New OB Intake  I connected with  [name] on 09/19/21 at 11:15 AM EST by MyChart Video Visit and verified that I am speaking with the correct person using two identifiers. Nurse is located at [facility] and pt is located at home.  I discussed the limitations, risks, security and privacy concerns of performing an evaluation and management service by telephone and the availability of in person appointments. I also discussed with the patient that there may be a patient responsible charge related to this service. The patient expressed understanding and agreed to proceed.  I explained I am completing New OB Intake today. We discussed her EDD of 11/27/21 that is based on LMP of 07/14/21. Pt is G2/P1. I reviewed her allergies, medications, Medical/Surgical/OB history, and appropriate screenings. I informed her of [facility] services. Based on history, this is a/an  pregnancy uncomplicated .   Patient Active Problem List   Diagnosis Date Noted   Vaginal delivery 08/25/2020   Type 3a perineal laceration 08/25/2020   Indication for care in labor and delivery, antepartum 08/23/2020   Genetic carrier 04/16/2020   Supervision of other normal pregnancy, antepartum 03/29/2020   Fibroid, uterine 03/29/2020   Asthma     Concerns addressed today  Delivery Plans:  Plans to deliver at [facility].   MyChart/Babyscripts MyChart access verified. I explained pt will have some visits in office and some virtually. Babyscripts instructions given and order placed. Patient verifies receipt of registration text/e-mail. Account successfully created and app downloaded.  Blood Pressure Cuff  Blood pressure cuff ordered for patient to pick-up from [employer]. Explained after first prenatal appt pt will check weekly and document in 105. Pt has her own BP Cuff.  Weight scale: Patient does / does not  have weight scale. Weight scale ordered for patient to pick up from [employer].   Anatomy US Explained  first scheduled [place] will be around 19 weeks. Anatomy US scheduled for 11/27/21 at 1:15p. Pt notified to arrive at 1:00p.  Labs Discussed Johnsie Cancel genetic screening with patient. Would like both Panorama and Horizon drawn at new OB visit. Routine prenatal labs needed.  Covid Vaccine Patient has covid vaccine.   CenteringPregnancy Candidate?  If yes, offer as possibility  Mother/ Baby Dyad Candidate?    If yes, offer as possibility  Informed patient of [facility] website  and placed link in her AVS.   Social Determinants of Health Food Insecurity: Patient denies food insecurity. WIC Referral: Patient is interested in referral to [facility].  Transportation: Patient denies transportation needs. Childcare: Discussed no children allowed at ultrasound appointments. Offered childcare services; patient declines childcare services at this time.  Send link to Pregnancy Navigators   Placed OB Box on problem list and updated  First visit review I reviewed new OB appt with pt. I explained she will have a pelvic exam, ob bloodwork with genetic screening, and PAP smear. Explained pt will be seen by Dr. Dione Plover at first visit; encounter routed to appropriate provider. Explained that patient will be seen by pregnancy navigator following visit with provider. [facility] information placed in AVS.   Bethanne Ginger, [place] 09/19/2021  11:21 AM

## 2021-09-19 NOTE — MAU Note (Signed)
Presents with c/o intermittent sharp pain in lower abdomen that began yesterday evening.  Denies VB.  Reports hasn't taken any meds to treat pain.

## 2021-09-19 NOTE — Patient Instructions (Signed)
°  At our [facility], we work as an integrated team, providing care to address both physical and emotional health. Your medical provider may refer you to see our Behavioral Health Clinician (BHC) on the same day you see your medical provider, as availability permits; often scheduled virtually at your convenience.  Our BHC is available to all patients, visits generally last between 20-30 minutes, but can be longer or shorter, depending on patient need. The BHC offers help with stress management, coping with symptoms of depression and anxiety, major life changes , sleep issues, changing risky behavior, grief and loss, life stress, working on personal life goals, and  behavioral health issues, as these all affect your overall health and wellness.  The BHC is NOT available for the following: FMLA paperwork, court-ordered evaluations, specialty assessments (custody or disability), letters to employers, or obtaining certification for an emotional support animal. The BHC does not provide long-term therapy. You have the right to refuse integrated behavioral health services, or to reschedule to see the BHC at a later date.  Confidentiality exception: If it is suspected that a child or disabled adult is being abused or neglected, we are required by law to report that to either Child Protective Services or Adult Protective Services.  If you have a diagnosis of Bipolar affective disorder, Schizophrenia, or recurrent Major depressive disorder, we will recommend that you establish care with a psychiatrist, as these are lifelong, chronic conditions, and we want your overall emotional health and medications to be more closely monitored. If you anticipate needing extended maternity leave due to mental health issues postpartum, it it recommended you inform your medical provider, so we can put in a referral to a psychiatrist as soon as possible. The BHC is unable to recommend an extended maternity leave for mental  health issues. Your medical provider or BHC may refer you to a therapist for ongoing, traditional therapy, or to a psychiatrist, for medication management, if it would benefit your overall health. Depending on your insurance, you may have a copay or be charged a deductible, depending on your insurance, to see the BHC. If you are uninsured, it is recommended that you apply for financial assistance. (Forms may be requested at the front desk for in-person visits, via MyChart, or request a form during a virtual visit).  If you see the BHC more than 6 times, you will have to complete a comprehensive clinical assessment interview with the BHC to resume integrated services.  For virtual visits with the BHC, you must be physically in the state of [place] at the time of the visit. For example, if you live in [place], you will have to do an in-person visit with the BHC, and your out-of-state insurance may not cover behavioral health services in [place]. If you are going out of the state or country for any reason, the BHC may see you virtually when you return to [place], but not while you are physically outside of [place].    

## 2021-09-19 NOTE — Progress Notes (Signed)
Left prior to being seen by MAU provider.

## 2021-09-20 ENCOUNTER — Inpatient Hospital Stay (HOSPITAL_COMMUNITY)
Admission: AD | Admit: 2021-09-20 | Discharge: 2021-09-20 | Disposition: A | Discharge status: Home / Self Care | Payer: Medicaid Other | Attending: Obstetrics and Gynecology | Admitting: Obstetrics and Gynecology

## 2021-09-20 ENCOUNTER — Encounter: Payer: Self-pay | Admitting: *Deleted

## 2021-09-20 ENCOUNTER — Other Ambulatory Visit: Payer: Self-pay | Admitting: Family Medicine

## 2021-09-20 ENCOUNTER — Encounter (HOSPITAL_COMMUNITY): Payer: Self-pay | Admitting: Obstetrics and Gynecology

## 2021-09-20 ENCOUNTER — Inpatient Hospital Stay (HOSPITAL_COMMUNITY): Payer: Medicaid Other

## 2021-09-20 DIAGNOSIS — O26891 Other specified pregnancy related conditions, first trimester: Secondary | ICD-10-CM | POA: Diagnosis present

## 2021-09-20 DIAGNOSIS — R1030 Lower abdominal pain, unspecified: Secondary | ICD-10-CM | POA: Diagnosis not present

## 2021-09-20 DIAGNOSIS — R1031 Right lower quadrant pain: Secondary | ICD-10-CM | POA: Diagnosis not present

## 2021-09-20 DIAGNOSIS — B9689 Other specified bacterial agents as the cause of diseases classified elsewhere: Secondary | ICD-10-CM | POA: Diagnosis not present

## 2021-09-20 DIAGNOSIS — O23591 Infection of other part of genital tract in pregnancy, first trimester: Secondary | ICD-10-CM | POA: Diagnosis not present

## 2021-09-20 DIAGNOSIS — Z3A09 9 weeks gestation of pregnancy: Secondary | ICD-10-CM | POA: Insufficient documentation

## 2021-09-20 DIAGNOSIS — Z86018 Personal history of other benign neoplasm: Secondary | ICD-10-CM | POA: Insufficient documentation

## 2021-09-20 DIAGNOSIS — R109 Unspecified abdominal pain: Secondary | ICD-10-CM

## 2021-09-20 LAB — CBC
HCT: 34.7 % — ABNORMAL LOW (ref 36.0–46.0)
Hemoglobin: 11.3 g/dL — ABNORMAL LOW (ref 12.0–15.0)
MCH: 27.3 pg (ref 26.0–34.0)
MCHC: 32.6 g/dL (ref 30.0–36.0)
MCV: 83.8 fL (ref 80.0–100.0)
Platelets: 334 K/uL (ref 150–400)
RBC: 4.14 MIL/uL (ref 3.87–5.11)
RDW: 16.3 % — ABNORMAL HIGH (ref 11.5–15.5)
WBC: 6.5 K/uL (ref 4.0–10.5)
nRBC: 0 % (ref 0.0–0.2)

## 2021-09-20 LAB — URINALYSIS, ROUTINE W REFLEX MICROSCOPIC
Bilirubin Urine: NEGATIVE
Glucose, UA: NEGATIVE mg/dL
Hgb urine dipstick: NEGATIVE
Ketones, ur: NEGATIVE mg/dL
Leukocytes,Ua: NEGATIVE
Nitrite: NEGATIVE
Protein, ur: NEGATIVE mg/dL
Specific Gravity, Urine: 1.024 (ref 1.005–1.030)
pH: 7 (ref 5.0–8.0)

## 2021-09-20 LAB — HCG, QUANTITATIVE, PREGNANCY: hCG, Beta Chain, Quant, S: 216883 m[IU]/mL — ABNORMAL HIGH (ref <5)

## 2021-09-20 LAB — WET PREP, GENITAL
Sperm: NONE SEEN
Trich, Wet Prep: NONE SEEN
WBC, Wet Prep HPF POC: >=10 — AB (ref <10)

## 2021-09-20 LAB — ABO/RH: ABO/RH(D): A POS

## 2021-09-20 MED ORDER — TERCONAZOLE 0.8 % VA CREA: 1.0000 | TOPICAL_CREAM | Freq: Every day | VAGINAL | 0 refills | Status: AC

## 2021-09-20 MED ORDER — METRONIDAZOLE 0.75 % VA GEL: 1.0000 | Freq: Two times a day (BID) | VAGINAL | 0 refills | Status: AC

## 2021-09-20 NOTE — Discharge Instructions (Signed)
You came to the MAU because you have lower abdominal pain and vaginal discharge. We found you have a yeast infection and BV which we sent treatment for to your pharmacy. We also did an ultrasound and pregnancy hormone labs which confirmed that your pregnancy is in your uterus and had the expected amount of pregnancy hormone. We think your pain could be related to the vaginal infections and early pregnancy.  Encouraged to return if you develop worsening of symptoms, increase in pain, fever, or other concerning symptoms.

## 2021-09-20 NOTE — MAU Provider Note (Signed)
History     CSN: [id number]  Arrival date and time: 09/20/21 1142   None     Chief Complaint  Patient presents with   Abdominal Pain   Vaginal Discharge   HPI  21yo G2P1 at [redacted]w[redacted]d who presents with 3 days of lower abdominal cramping and pain (worse on right) and 1 day of vaginal discharge  #abd pain Since Mon night Lower abdomen Mostly RLQ Sometimes radiates to LLQ Intermittently worsens or gets better Achy Hasn't tried any meds Better when laying on side Currently 5/10 OB hx: prior pregnancy no complications Hx of fibroids and ovarian cyst  #Discharge 1 day Used panty liner Thick and clear and mucousy  OB History     Gravida  2   Para  1   Term  1   Preterm      AB      Living  1      SAB      IAB      Ectopic      Multiple  0   Live Births  1           Past Medical History:  Diagnosis Date   Asthma    Depression    Fibroid    Heart murmur     Past Surgical History:  Procedure Laterality Date   NO PAST SURGERIES     PR CIRCUMCISION,OTHR,NEWBORN  08/25/2020        Family History  Problem Relation Age of Onset   Healthy Mother    Healthy Father     Social History   Tobacco Use   Smoking status: Never   Smokeless tobacco: Never  Vaping Use   Vaping Use: Never used  Substance Use Topics   Alcohol use: No   Drug use: No    Allergies:  Allergies  Allergen Reactions   Justicia Adhatoda (Malabar Nut Tree) [Justicia Adhatoda] Anaphylaxis and Hives    PT STATE SHE IS ALLERGIC TO ALL TREE NUTS    Cat Hair Extract Hives and Swelling   Dog Epithelium Allergy Skin Test Hives and Swelling    Medications Prior to Admission  Medication Sig Dispense Refill Last Dose   ibuprofen (ADVIL) 200 MG tablet Take 800 mg by mouth daily as needed for headache or cramping (pain). (Patient not taking: Reported on 09/19/2021)      medroxyPROGESTERone (DEPO-PROVERA) 150 MG/ML injection Inject 150 mg into the muscle every 3 (three) months.  (Patient not taking: Reported on 09/19/2021)      Prenatal Vit-Fe Fumarate-FA (MULTIVITAMIN-PRENATAL) 27-0.8 MG TABS tablet Take 1 tablet by mouth daily at 12 noon.       Review of Systems  Constitutional:  Negative for chills and fever.  Respiratory:  Negative for shortness of breath.   Gastrointestinal:  Positive for abdominal pain and nausea. Negative for constipation, diarrhea and vomiting.  Genitourinary:  Negative for dysuria.  Musculoskeletal:  Negative for back pain.  Neurological:  Negative for headaches.  Hematological:  Negative for adenopathy.  All other systems reviewed and are negative. Physical Exam   Blood pressure 111/70, pulse 87, temperature 98.2 F (36.8 C), temperature source Oral, resp. rate 16, height 5\' 7"  (1.702 m), weight 70.4 kg, last menstrual period 07/14/2021, SpO2 99 %, not currently breastfeeding.  Physical Exam Vitals reviewed.  Constitutional:      Appearance: She is well-developed. She is not ill-appearing.  HENT:     Head: Normocephalic.  Cardiovascular:     Rate and Rhythm:  Normal rate and regular rhythm.  Pulmonary:     Effort: Pulmonary effort is normal.  Abdominal:     General: Bowel sounds are normal. There is no distension.     Palpations: Abdomen is soft.     Comments: Mild bilateral lower quadrant abdominal pain  Skin:    General: Skin is warm.     Capillary Refill: Capillary refill takes less than 2 seconds.  Neurological:     Mental Status: She is alert.    MAU Course  Procedures [place] TECHNIQUE: Transabdominal ultrasound was performed for evaluation of the gestation as well as the maternal uterus and adnexal regions.   COMPARISON:  None.   FINDINGS: Intrauterine gestational sac: Single   Yolk sac:  Visualized.   Embryo:  Visualized.   Cardiac Activity: Visualized.   Heart Rate: 171 bpm   CRL:   29 mm   9 w 4 d                  [place] EDC: 04/21/2022   Subchorionic hemorrhage:  None visualized.   Maternal  uterus/adnexae: Normal appearance of left ovary. Right ovary is not visualized directly, however no mass or abnormal free fluid identified.   IMPRESSION: Single living IUP with estimated gestational age of [redacted] weeks 4 days, and [place] EDC of 04/21/2022.   No maternal uterine or adnexal abnormality identified  MDM Moderate 3 days of lower abdominal pain at [redacted] weeks gestation in setting of vaginal discharge, no bleeding. [place] with confirmed IUP with HR of 171. Likely related to possible ovarian cyst (patient reports had ovarian cyst in past and can't remember which side and [place] without visualization of right ovary) and also with vaginal infection. Ectopic ruled out.  Assessment and Plan    Lower abdominal pain 3 days of lower abdominal pain at [redacted] weeks gestation in setting of vaginal discharge, no bleeding. [place] with confirmed IUP with HR of 171. Normal appearing left ovary, right ovary difficult to visualize and no mass or free fluid. No uterine abnormality. Vital signs normal and stable. CBC with  normal WBC. Pain somewhat improved with tylenol. Given ectopic pregnancy ruled out and normal labs. Low suspicion for systemic infection and appendicitis at this time based on exam and history - ok to discharge home with treatment for yeast and BV (see below) - can take Tylenol PRN for pain - return precautions provided including vaginal bleeding, worsening abdominal pain that isnt improving with analgesics, fevers or chills   2 Vaginal discharge Happening for 1 day. Self swab collected. Positive for yeast and BV. - treatment sent to patient's pharmacy for yeast and BV   Renard Matter, MD, MPH OB Fellow, Faculty [facility]

## 2021-09-20 NOTE — MAU Note (Signed)
Beverly Mckenzie is a 21 y.o. at [redacted]w[redacted]d here in MAU reporting: since Monday has been having lower abdominal pain, states sharp pain and it is constant with changes in intensity. Was here yesterday but left due to her son needing to be picked up from school. Last night noted some fluid, states it is clear. Still leaking today.  Onset of complaint: ongoing  Pain score: 7/10  Vitals:   09/20/21 1200  BP: 111/71  Pulse: 92  Resp: 16  Temp: 98.2 F (36.8 C)  SpO2: 99%     Lab orders placed from triage: UA

## 2021-09-20 NOTE — MAU Provider Note (Signed)
History     CSN: [id number]  Arrival date and time: 09/20/21 1142   Event Date/Time   First Provider Initiated Contact with Patient 09/20/21 1217      Chief Complaint  Patient presents with   Abdominal Pain   Vaginal Discharge   Abdominal Pain Associated symptoms include headaches (very mild, has not eaten today). Pertinent negatives include no constipation, diarrhea, dysuria, fever, nausea or vomiting.  Vaginal Discharge The patient's primary symptoms include vaginal discharge. Associated symptoms include abdominal pain and headaches (very mild, has not eaten today). Pertinent negatives include no chills, constipation, diarrhea, dysuria, fever, nausea or vomiting.  [name] is a 21 y.o. G2P1001 at [redacted]w[redacted]d who presents to MAU today with complaint of lower abdominal pain x 2 days and new vaginal discharge since last night. Pain comes in waves and is currently at a 7/10, worst rated 8/10 mostly localizes to RLQ, but sometimes radiates across entire lower abdomen. Describes it as mostly aching pain, but jab-like at its worst. Has not tried any medications yet due to pregnancy, improves when laying on her side. No aggravating factors. Patient has h/o uterine fibroids dx during first pregnancy. Denies N/V/D/C, dysuria, vaginal bleeding. No US obtained yet this pregnancy.  Discharge is thick, clear, and mucus-like consistency. Used panty liner last night, but discharge has stopped this morning. Not sexually active.  OB History     Gravida  2   Para  1   Term  1   Preterm      AB      Living  1      SAB      IAB      Ectopic      Multiple  0   Live Births  1           Past Medical History:  Diagnosis Date   Asthma    Depression    Fibroid    Heart murmur     Past Surgical History:  Procedure Laterality Date   NO PAST SURGERIES     PR CIRCUMCISION,OTHR,NEWBORN  08/25/2020        Family History  Problem Relation Age of Onset   Healthy Mother     Healthy Father     Social History   Tobacco Use   Smoking status: Never   Smokeless tobacco: Never  Vaping Use   Vaping Use: Never used  Substance Use Topics   Alcohol use: No   Drug use: No    Allergies:  Allergies  Allergen Reactions   Justicia Adhatoda (Malabar Nut Tree) [Justicia Adhatoda] Anaphylaxis and Hives    PT STATE SHE IS ALLERGIC TO ALL TREE NUTS    Cat Hair Extract Hives and Swelling   Dog Epithelium Allergy Skin Test Hives and Swelling    Medications Prior to Admission  Medication Sig Dispense Refill Last Dose   ibuprofen (ADVIL) 200 MG tablet Take 800 mg by mouth daily as needed for headache or cramping (pain). (Patient not taking: Reported on 09/19/2021)      medroxyPROGESTERone (DEPO-PROVERA) 150 MG/ML injection Inject 150 mg into the muscle every 3 (three) months. (Patient not taking: Reported on 09/19/2021)      Prenatal Vit-Fe Fumarate-FA (MULTIVITAMIN-PRENATAL) 27-0.8 MG TABS tablet Take 1 tablet by mouth daily at 12 noon.       Review of Systems  Constitutional:  Negative for chills and fever.  Eyes:  Negative for visual disturbance.  Respiratory:  Negative for cough and shortness of  breath.   Gastrointestinal:  Positive for abdominal pain. Negative for constipation, diarrhea, nausea and vomiting.  Genitourinary:  Positive for vaginal discharge. Negative for dysuria and vaginal bleeding.  Neurological:  Positive for headaches (very mild, has not eaten today).  Physical Exam   Blood pressure 111/70, pulse 87, temperature 98.2 F (36.8 C), temperature source Oral, resp. rate 16, height 5\' 7"  (1.702 m), weight 70.4 kg, last menstrual period 07/14/2021, SpO2 99 %, not currently breastfeeding.  Physical Exam Vitals and nursing note reviewed.  Constitutional:      General: She is awake.     Appearance: Normal appearance. She is well-developed.  HENT:     Head: Normocephalic.  Abdominal:     General: Bowel sounds are normal.     Palpations: Abdomen is  soft.     Tenderness: There is abdominal tenderness in the right lower quadrant. There is no guarding or rebound.  Neurological:     Mental Status: She is alert.   Results for orders placed or performed during the hospital encounter of 09/20/21 (from the past 24 hour(s))  CBC     Status: Abnormal   Collection Time: 09/20/21  1:31 PM  Result Value Ref Range   WBC 6.5 4.0 - 10.5 K/uL   RBC 4.14 3.87 - 5.11 MIL/uL   Hemoglobin 11.3 (L) 12.0 - 15.0 g/dL   HCT 34.7 (L) 36.0 - 46.0 %   MCV 83.8 80.0 - 100.0 fL   MCH 27.3 26.0 - 34.0 pg   MCHC 32.6 30.0 - 36.0 g/dL   RDW 16.3 (H) 11.5 - 15.5 %   Platelets 334 150 - 400 K/uL   nRBC 0.0 0.0 - 0.2 %  hCG, quantitative, pregnancy     Status: Abnormal   Collection Time: 09/20/21  1:31 PM  Result Value Ref Range   hCG, Beta Chain, Quant, S 216,883 (H) <5 mIU/mL  ABO/Rh     Status: None   Collection Time: 09/20/21  1:31 PM  Result Value Ref Range   ABO/RH(D) A POS    No rh immune globuloin      NOT A RH IMMUNE GLOBULIN CANDIDATE, PT RH POSITIVE Performed at [facility], [place] [address] [place],  [address]   Wet prep, genital     Status: Abnormal   Collection Time: 09/20/21  1:47 PM  Result Value Ref Range   Yeast Wet Prep HPF POC PRESENT (A) NONE SEEN   Trich, Wet Prep NONE SEEN NONE SEEN   Clue Cells Wet Prep HPF POC PRESENT (A) NONE SEEN   WBC, Wet Prep HPF POC >=10 (A) <10   Sperm NONE SEEN   Urinalysis, Routine w reflex microscopic Urine, Clean Catch     Status: Abnormal   Collection Time: 09/20/21  2:03 PM  Result Value Ref Range   Color, Urine YELLOW YELLOW   APPearance HAZY (A) CLEAR   Specific Gravity, Urine 1.024 1.005 - 1.030   pH 7.0 5.0 - 8.0   Glucose, UA NEGATIVE NEGATIVE mg/dL   Hgb urine dipstick NEGATIVE NEGATIVE   Bilirubin Urine NEGATIVE NEGATIVE   Ketones, ur NEGATIVE NEGATIVE mg/dL   Protein, ur NEGATIVE NEGATIVE mg/dL   Nitrite NEGATIVE NEGATIVE   Leukocytes,Ua NEGATIVE NEGATIVE    US OB  Comp Less 14 Wks  Result Date: 09/20/2021 CLINICAL DATA:  Pelvic pain in 1st trimester pregnancy. EXAM: OBSTETRIC <14 WK ULTRASOUND TECHNIQUE: Transabdominal ultrasound was performed for evaluation of the gestation as well as the maternal  uterus and adnexal regions. COMPARISON:  None. FINDINGS: Intrauterine gestational sac: Single Yolk sac:  Visualized. Embryo:  Visualized. Cardiac Activity: Visualized. Heart Rate: 171 bpm CRL:   29 mm   9 w 4 d                  [place] EDC: 04/21/2022 Subchorionic hemorrhage:  None visualized. Maternal uterus/adnexae: Normal appearance of left ovary. Right ovary is not visualized directly, however no mass or abnormal free fluid identified. IMPRESSION: Single living IUP with estimated gestational age of [redacted] weeks 4 days, and [place] EDC of 04/21/2022. No maternal uterine or adnexal abnormality identified. Electronically Signed   By: Marlaine Hind M.D.   On: 09/20/2021 14:12      MAU Course  Procedures  MDM Tylenol given for pain.  CBC, T&S, quantitative hCG ordered & reviewed. Wet prep, GC self swabs collected. Wet prep positive for yeast and BV. GC pending. OB US shows single living IUP.  Assessment and Plan  Bacterial vaginosis  -Rx given for Flagyl topical cream x 7 days. 2. Vaginal candidiases  -Rx given for Terconazole topical cream x 5 days. OK for discharge home at this time.  Wilhemina Cash, PA-S 09/20/2021, 1:52 PM

## 2021-09-21 LAB — GC/CHLAMYDIA PROBE AMP (~~LOC~~) NOT AT ARMC
Chlamydia: NEGATIVE
Comment: NEGATIVE
Comment: NORMAL
Neisseria Gonorrhea: NEGATIVE

## 2021-09-27 ENCOUNTER — Ambulatory Visit: Payer: Medicaid Other | Admitting: Emergency Medicine

## 2021-10-04 ENCOUNTER — Ambulatory Visit (INDEPENDENT_AMBULATORY_CARE_PROVIDER_SITE_OTHER): Payer: Medicaid Other | Admitting: Family Medicine

## 2021-10-04 ENCOUNTER — Other Ambulatory Visit: Payer: Self-pay

## 2021-10-04 ENCOUNTER — Other Ambulatory Visit (HOSPITAL_COMMUNITY)
Admission: RE | Admit: 2021-10-04 | Discharge: 2021-10-04 | Disposition: A | Discharge status: Home / Self Care | Payer: Medicaid Other | Source: Ambulatory Visit | Attending: Family Medicine | Admitting: Family Medicine

## 2021-10-04 ENCOUNTER — Encounter: Payer: Self-pay | Admitting: Family Medicine

## 2021-10-04 ENCOUNTER — Telehealth: Payer: Self-pay

## 2021-10-04 VITALS — BP 103/71 | HR 92 | Wt 152.5 lb

## 2021-10-04 DIAGNOSIS — J4520 Mild intermittent asthma, uncomplicated: Secondary | ICD-10-CM

## 2021-10-04 DIAGNOSIS — O219 Vomiting of pregnancy, unspecified: Secondary | ICD-10-CM

## 2021-10-04 DIAGNOSIS — Z3480 Encounter for supervision of other normal pregnancy, unspecified trimester: Secondary | ICD-10-CM | POA: Diagnosis not present

## 2021-10-04 DIAGNOSIS — Z148 Genetic carrier of other disease: Secondary | ICD-10-CM

## 2021-10-04 LAB — POCT URINALYSIS DIP (DEVICE)
Bilirubin Urine: NEGATIVE
Glucose, UA: NEGATIVE mg/dL
Ketones, ur: NEGATIVE mg/dL
Leukocytes,Ua: NEGATIVE
Nitrite: NEGATIVE
Protein, ur: NEGATIVE mg/dL
Specific Gravity, Urine: >=1.03 (ref 1.005–1.030)
Urobilinogen, UA: 0.2 mg/dL (ref 0.0–1.0)
pH: 5.5 (ref 5.0–8.0)

## 2021-10-04 MED ORDER — PROMETHAZINE HCL 25 MG PO TABS: 25.0000 mg | ORAL_TABLET | Freq: Four times a day (QID) | ORAL | 1 refills | Status: DC | PRN

## 2021-10-04 MED ORDER — ONDANSETRON 4 MG PO TBDP: 4.0000 mg | ORAL_TABLET | Freq: Three times a day (TID) | ORAL | 2 refills | Status: DC | PRN

## 2021-10-04 NOTE — Progress Notes (Signed)
Subjective:   Beverly Mckenzie is a 21 y.o. G2P1001 at 80w5dby 9wk [place] seen today for her first obstetrical visit.  Her obstetrical history is significant for  asthma . Patient does intend to breast feed. Pregnancy history fully reviewed.  Patient reports no complaints.  HISTORY: OB History  Gravida Para Term Preterm AB Living  _0 0 0 1  SAB IAB Ectopic Multiple Live Births  0 0 0 0 1    # Outcome Date GA Lbr Len/2nd Weight Sex Delivery Anes PTL Lv  2 Current           1 Term 08/23/20 483w0d1:35 / 01:04 7 lb 4.4 oz (3.3 kg) M Vag-Spont EPI  LIV     Name: Blakely,BOY Douglas     Apgar1: 9  Apgar5: 9     Last pap smear: No results found for: DIAGPAP, HPV, HPVHIGH N/a  Past Medical History:  Diagnosis Date   Asthma    Depression    Fibroid    Heart murmur    Past Surgical History:  Procedure Laterality Date   NO PAST SURGERIES     PR CIRCUMCISION,OTHR,NEWBORN  08/25/2020       Family History  Problem Relation Age of Onset   Healthy Mother    Healthy Father    Social History   Tobacco Use   Smoking status: Never   Smokeless tobacco: Never  Vaping Use   Vaping Use: Never used  Substance Use Topics   Alcohol use: No   Drug use: No   Allergies  Allergen Reactions   Justicia Adhatoda (Malabar Nut Tree) [Justicia Adhatoda] Anaphylaxis and Hives    PT STATE SHE IS ALLERGIC TO ALL TREE NUTS    Cat Hair Extract Hives and Swelling   Dog Epithelium Allergy Skin Test Hives and Swelling   Current Outpatient Medications on File Prior to Visit  Medication Sig Dispense Refill   Prenatal Vit-Fe Fumarate-FA (MULTIVITAMIN-PRENATAL) 27-0.8 MG TABS tablet Take 1 tablet by mouth daily at 12 noon.     No current facility-administered medications on file prior to visit.     Exam   Vitals:   10/04/21 1333  BP: 103/71  Pulse: 92  Weight: 152 lb 8 oz (69.2 kg)   Fetal Heart Rate (bpm): 159  System: General: well-developed, well-nourished female in no  acute distress   Skin: normal coloration and turgor, no rashes   Neurologic: oriented, normal, negative, normal mood   Extremities: normal strength, tone, and muscle mass, ROM of all joints is normal   HEENT PERRLA, extraocular movement intact and sclera clear, anicteric   Neck supple and no masses   Respiratory:  no respiratory distress      Assessment:   Pregnancy: G2P1001 Patient Active Problem List   Diagnosis Date Noted   Supervision of other normal pregnancy, antepartum 09/19/2021   Vaginal delivery 08/25/2020   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 Supervision of other normal pregnancy, antepartum BP and FHR normal Initial labs drawn. Continue prenatal vitamins. Genetic Screening discussed, NIPS: reviewed and ordered. Previously screened as a carrier for SMA and Hurler. Panorama collected today. Ultrasound discussed; fetal anatomic survey: ordered. Problem list reviewed and updated. The nature of [place] MDs and other  Advanced Practice Providers was explained to patient; also emphasized that residents, students are part of our team.  2. 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 Mild intermittent asthma without complication Stable Last inhaler use two years ago   Routine obstetric precautions reviewed. Return in 4 weeks (on 11/01/2021) for [facility], ob visit.

## 2021-10-04 NOTE — Patient Instructions (Signed)
Second Trimester of Pregnancy The second trimester of pregnancy is from week 13 through week 27. This is months 4 through 6 of pregnancy. The second trimester is often a time when you feel your best. Your body has adjusted to being pregnant, and you begin to feel better physically. During the second trimester: Morning sickness has lessened or stopped completely. You may have more energy. You may have an increase in appetite. The second trimester is also a time when the unborn baby (fetus) is growing rapidly. At the end of the sixth month, the fetus may be up to 12 inches long and weigh about 1 pounds. You will likely begin to feel the baby move (quickening) between 16 and 20 weeks of pregnancy. Body changes during your second trimester Your body continues to go through many changes during your second trimester. The changes vary and generally return to normal after the baby is born. Physical changes Your weight will continue to increase. You will notice your lower abdomen bulging out. You may begin to get stretch marks on your hips, abdomen, and breasts. Your breasts will continue to grow and to become tender. Dark spots or blotches (chloasma or mask of pregnancy) may develop on your face. A dark line from your belly button to the pubic area (linea nigra) may appear. You may have changes in your hair. These can include thickening of your hair, rapid growth, and changes in texture. Some people also have hair loss during or after pregnancy, or hair that feels dry or thin. Health changes You may develop headaches. You may have heartburn. You may develop constipation. You may develop hemorrhoids or swollen, bulging veins (varicose veins). Your gums may bleed and may be sensitive to brushing and flossing. You may urinate more often because the fetus is pressing on your bladder. You may have back pain. This is caused by: Weight gain. Pregnancy hormones that are relaxing the joints in your  pelvis. A shift in weight and the muscles that support your balance. Follow these instructions at home: Medicines Follow your health care provider's instructions regarding medicine use. Specific medicines may be either safe or unsafe to take during pregnancy. Do not take any medicines unless approved by your health care provider. Take a prenatal vitamin that contains at least 600 micrograms (mcg) of folic acid. Eating and drinking Eat a healthy diet that includes fresh fruits and vegetables, whole grains, good sources of protein such as meat, eggs, or tofu, and low-fat dairy products. Avoid raw meat and unpasteurized juice, milk, and cheese. These carry germs that can harm you and your baby. You may need to take these actions to prevent or treat constipation: Drink enough fluid to keep your urine pale yellow. Eat foods that are high in fiber, such as beans, whole grains, and fresh fruits and vegetables. Limit foods that are high in fat and processed sugars, such as fried or sweet foods. Activity Exercise only as directed by your health care provider. Most people can continue their usual exercise routine during pregnancy. Try to exercise for 30 minutes at least 5 days a week. Stop exercising if you develop contractions in your uterus. Stop exercising if you develop pain or cramping in the lower abdomen or lower back. Avoid exercising if it is very hot or humid or if you are at a high altitude. Avoid heavy lifting. If you choose to, you may have sex unless your health care provider tells you not to. Relieving pain and discomfort Wear a supportive bra  to prevent discomfort from breast tenderness. Take warm sitz baths to soothe any pain or discomfort caused by hemorrhoids. Use hemorrhoid cream if your health care provider approves. Rest with your legs raised (elevated) if you have leg cramps or low back pain. If you develop varicose veins: Wear support hose as told by your health care  provider. Elevate your feet for 15 minutes, 3-4 times a day. Limit salt in your diet. Safety Wear your seat belt at all times when driving or riding in a car. Talk with your health care provider if someone is verbally or physically abusive to you. Lifestyle Do not use hot tubs, steam rooms, or saunas. Do not douche. Do not use tampons or scented sanitary pads. Avoid cat litter boxes and soil used by cats. These carry germs that can cause birth defects in the baby and possibly loss of the fetus by miscarriage or stillbirth. Do not use herbal remedies, alcohol, illegal drugs, or medicines that are not approved by your health care provider. Chemicals in these products can harm your baby. Do not use any products that contain nicotine or tobacco, such as cigarettes, e-cigarettes, and chewing tobacco. If you need help quitting, ask your health care provider. General instructions During a routine prenatal visit, your health care provider will do a physical exam and other tests. He or she will also discuss your overall health. Keep all follow-up visits. This is important. Ask your health care provider for a referral to a local prenatal education class. Ask for help if you have counseling or nutritional needs during pregnancy. Your health care provider can offer advice or refer you to specialists for help with various needs. Where to find more information American Pregnancy Association: americanpregnancy.[place] and Gynecologists: PoolDevices.com.pt Office on [employer] Health: KeywordPortfolios.com.br Contact a health care provider if you have: A headache that does not go away when you take medicine. Vision changes or you see spots in front of your eyes. Mild pelvic cramps, pelvic pressure, or nagging pain in the abdominal area. Persistent nausea, vomiting, or diarrhea. A bad-smelling vaginal discharge or foul-smelling urine. Pain when you  urinate. Sudden or extreme swelling of your face, hands, ankles, feet, or legs. A fever. Get help right away if you: Have fluid leaking from your vagina. Have spotting or bleeding from your vagina. Have severe abdominal cramping or pain. Have difficulty breathing. Have chest pain. Have fainting spells. Have not felt your baby move for the time period told by your health care provider. Have new or increased pain, swelling, or redness in an arm or leg. Summary The second trimester of pregnancy is from week 13 through week 27 (months 4 through 6). Do not use herbal remedies, alcohol, illegal drugs, or medicines that are not approved by your health care provider. Chemicals in these products can harm your baby. Exercise only as directed by your health care provider. Most people can continue their usual exercise routine during pregnancy. Keep all follow-up visits. This is important. This information is not intended to replace advice given to you by your health care provider. Make sure you discuss any questions you have with your health care provider. Document Revised: 01/20/2020 Document Reviewed: 11/26/2019 Elsevier Patient Education  2022 [employer].  Contraception Choices Contraception, also called birth control, refers to methods or devices that prevent pregnancy. Hormonal methods Contraceptive implant A contraceptive implant is a thin, plastic tube that contains a hormone that prevents pregnancy. It is different from an intrauterine device (IUD). It  is inserted into the upper part of the arm by a health care provider. Implants can be effective for up to 3 years. Progestin-only injections Progestin-only injections are injections of progestin, a synthetic form of the hormone progesterone. They are given every 3 months by a health care provider. Birth control pills Birth control pills are pills that contain hormones that prevent pregnancy. They must be taken once a day, preferably at the  same time each day. A prescription is needed to use this method of contraception. Birth control patch The birth control patch contains hormones that prevent pregnancy. It is placed on the skin and must be changed once a week for three weeks and removed on the fourth week. A prescription is needed to use this method of contraception. Vaginal ring A vaginal ring contains hormones that prevent pregnancy. It is placed in the vagina for three weeks and removed on the fourth week. After that, the process is repeated with a new ring. A prescription is needed to use this method of contraception. Emergency contraceptive Emergency contraceptives prevent pregnancy after unprotected sex. They come in pill form and can be taken up to 5 days after sex. They work best the sooner they are taken after having sex. Most emergency contraceptives are available without a prescription. This method should not be used as your only form of birth control. Barrier methods Female condom A female condom is a thin sheath that is worn over the penis during sex. Condoms keep sperm from going inside a woman's body. They can be used with a sperm-killing substance (spermicide) to increase their effectiveness. They should be thrown away after one use. Female condom A female condom is a soft, loose-fitting sheath that is put into the vagina before sex. The condom keeps sperm from going inside a woman's body. They should be thrown away after one use. Diaphragm A diaphragm is a soft, dome-shaped barrier. It is inserted into the vagina before sex, along with a spermicide. The diaphragm blocks sperm from entering the uterus, and the spermicide kills sperm. A diaphragm should be left in the vagina for 6-8 hours after sex and removed within 24 hours. A diaphragm is prescribed and fitted by a health care provider. A diaphragm should be replaced every 1-2 years, after giving birth, after gaining more than 15 lb (6.8 kg), and after pelvic  surgery. Cervical cap A cervical cap is a round, soft latex or plastic cup that fits over the cervix. It is inserted into the vagina before sex, along with spermicide. It blocks sperm from entering the uterus. The cap should be left in place for 6-8 hours after sex and removed within 48 hours. A cervical cap must be prescribed and fitted by a health care provider. It should be replaced every 2 years. Sponge A sponge is a soft, circular piece of polyurethane foam with spermicide in it. The sponge helps block sperm from entering the uterus, and the spermicide kills sperm. To use it, you make it wet and then insert it into the vagina. It should be inserted before sex, left in for at least 6 hours after sex, and removed and thrown away within 30 hours. Spermicides Spermicides are chemicals that kill or block sperm from entering the cervix and uterus. They can come as a cream, jelly, suppository, foam, or tablet. A spermicide should be inserted into the vagina with an applicator at least 15-72 minutes before sex to allow time for it to work. The process must be repeated every time  you have sex. Spermicides do not require a prescription. Intrauterine contraception Intrauterine device (IUD) An IUD is a T-shaped device that is put in a woman's uterus. There are two types: Hormone IUD.This type contains progestin, a synthetic form of the hormone progesterone. This type can stay in place for 3-5 years. Copper IUD.This type is wrapped in copper wire. It can stay in place for 10 years. Permanent methods of contraception Female tubal ligation In this method, a woman's fallopian tubes are sealed, tied, or blocked during surgery to prevent eggs from traveling to the uterus. Hysteroscopic sterilization In this method, a small, flexible insert is placed into each fallopian tube. The inserts cause scar tissue to form in the fallopian tubes and block them, so sperm cannot reach an egg. The procedure takes about 3  months to be effective. Another form of birth control must be used during those 3 months. Female sterilization This is a procedure to tie off the tubes that carry sperm (vasectomy). After the procedure, the man can still ejaculate fluid (semen). Another form of birth control must be used for 3 months after the procedure. Natural planning methods Natural family planning In this method, a couple does not have sex on days when the woman could become pregnant. Calendar method In this method, the woman keeps track of the length of each menstrual cycle, identifies the days when pregnancy can happen, and does not have sex on those days. Ovulation method In this method, a couple avoids sex during ovulation. Symptothermal method This method involves not having sex during ovulation. The woman typically checks for ovulation by watching changes in her temperature and in the consistency of cervical mucus. Post-ovulation method In this method, a couple waits to have sex until after ovulation. Where to find more information Centers for Disease Control and Prevention: http://www.wolf.info/ Summary Contraception, also called birth control, refers to methods or devices that prevent pregnancy. Hormonal methods of contraception include implants, injections, pills, patches, vaginal rings, and emergency contraceptives. Barrier methods of contraception can include female condoms, female condoms, diaphragms, cervical caps, sponges, and spermicides. There are two types of IUDs (intrauterine devices). An IUD can be put in a woman's uterus to prevent pregnancy for 3-5 years. Permanent sterilization can be done through a procedure for males and females. Natural family planning methods involve nothaving sex on days when the woman could become pregnant. This information is not intended to replace advice given to you by your health care provider. Make sure you discuss any questions you have with your health care provider. Document  Revised: 01/18/2020 Document Reviewed: 01/18/2020 Elsevier Patient Education  [place].

## 2021-10-04 NOTE — Telephone Encounter (Signed)
Unable to leave message due to voicemail box not set up.  MyChart message.    Beverly Mckenzie  10/04/21

## 2021-10-05 ENCOUNTER — Encounter: Payer: Self-pay | Admitting: Family Medicine

## 2021-10-05 LAB — CBC/D/PLT+RPR+RH+ABO+RUBIGG...
Antibody Screen: NEGATIVE
Basophils Absolute: 0 10*3/uL (ref 0.0–0.2)
Basos: 0 %
EOS (ABSOLUTE): 0.3 10*3/uL (ref 0.0–0.4)
Eos: 4 %
HCV Ab: <0.1 s/co ratio (ref 0.0–0.9)
HIV Screen 4th Generation wRfx: NONREACTIVE
Hematocrit: 35.2 % (ref 34.0–46.6)
Hemoglobin: 11.6 g/dL (ref 11.1–15.9)
Hepatitis B Surface Ag: NEGATIVE
Immature Grans (Abs): 0 10*3/uL (ref 0.0–0.1)
Immature Granulocytes: 0 %
Lymphocytes Absolute: 1.8 10*3/uL (ref 0.7–3.1)
Lymphs: 25 %
MCH: 27.3 pg (ref 26.6–33.0)
MCHC: 33 g/dL (ref 31.5–35.7)
MCV: 83 fL (ref 79–97)
Monocytes Absolute: 0.5 10*3/uL (ref 0.1–0.9)
Monocytes: 7 %
Neutrophils Absolute: 4.5 10*3/uL (ref 1.4–7.0)
Neutrophils: 64 %
Platelets: 395 10*3/uL (ref 150–450)
RBC: 4.25 x10E6/uL (ref 3.77–5.28)
RDW: 15.8 % — ABNORMAL HIGH (ref 11.7–15.4)
RPR Ser Ql: NONREACTIVE
Rh Factor: POSITIVE
Rubella Antibodies, IGG: 1.6 index (ref >0.99)
WBC: 7 10*3/uL (ref 3.4–10.8)

## 2021-10-05 LAB — GC/CHLAMYDIA PROBE AMP (~~LOC~~) NOT AT ARMC
Chlamydia: NEGATIVE
Comment: NEGATIVE
Comment: NORMAL
Neisseria Gonorrhea: NEGATIVE

## 2021-10-05 LAB — HCV INTERPRETATION

## 2021-10-05 LAB — HEMOGLOBIN A1C
Est. average glucose Bld gHb Est-mCnc: 114 mg/dL
Hgb A1c MFr Bld: 5.6 % (ref 4.8–5.6)

## 2021-10-06 ENCOUNTER — Encounter: Payer: Self-pay | Admitting: *Deleted

## 2021-10-06 LAB — URINE CULTURE, OB REFLEX

## 2021-10-06 LAB — CULTURE, OB URINE

## 2021-10-08 NOTE — Progress Notes (Unsigned)
PRENATAL VISIT NOTE- Centering Pregnancy Cycle {NUMBERS:20191}, Session # {NUMBERS:20191}  Subjective:  Beverly Mckenzie is a 21 y.o. G2P1001 at 9w2dbeing seen today for ongoing prenatal care through Centering Pregnancy.  She is currently monitored for the following issues for this {Blank single:19197::"high-risk","low-risk"} pregnancy and has Fibroid, uterine; Asthma; Genetic carrier; Indication for care in labor and delivery, antepartum; Vaginal delivery; Type 3a perineal laceration; and Supervision of other normal pregnancy, antepartum on their problem list.  Patient reports {sx:14538}.   .  .   . ***Denies leaking of fluid/ROM.   The following portions of the patient's history were reviewed and updated as appropriate: allergies, current medications, past family history, past medical history, past social history, past surgical history and problem list. Problem list updated.  Objective:  There were no vitals filed for this visit.  Fetal Status:           General:  Alert, oriented and cooperative. Patient is in no acute distress.  Skin: Skin is warm and dry. No rash noted.   Cardiovascular: Normal heart rate noted  Respiratory: Normal respiratory effort, no problems with respiration noted  Abdomen: Soft, gravid, appropriate for gestational age.        Pelvic: {Blank single:19197::"Cervical exam performed","Cervical exam deferred"}        Extremities: Normal range of motion.     Mental Status: Normal mood and affect. Normal behavior. Normal judgment and thought content.   Assessment and Plan:  Pregnancy: G2P1001 at 168w2d1. Supervision of other normal pregnancy, antepartum   Centering Pregnancy, Session#1: Introduction to model of care. Group determined rules for self-governance and closing phrase. Oriented group to space and mother's notebook.   Facilitated discussion today:  common discomforts, When to call practice  Mindfulness activity completed as well as  introduction to deep breathing for childbirth preparation- Centering 3 Breaths  Fundal height and FHR appropriate today unless noted otherwise in plan of care. Patient to continue group care.    2. 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 Mild intermittent asthma without complication --No meds      {Blank single:19197::"Term","Preterm"} labor symptoms and general obstetric precautions including but not limited to vaginal bleeding, contractions, leaking of fluid and fetal movement were reviewed in detail with the patient. Please refer to After Visit Summary for other counseling recommendations.  No follow-ups on file.  Future Appointments  Date Time Provider [place]2/13/2023  9:00 AM CENTERING PROVIDER [facility]3/13/2023  9:00 AM [facility]ENTERING [facility]/10/2021  1:15 PM [facility] NURSE [facility]4/10/2021  1:30 PM [facility] US3 [facility]4/05/2022  9:00 AM CENTERING PROVIDER [facility]5/03/2022  9:00 AM CENTERING PROVIDER [facility]5/22/2023  9:00 AM CENTERING PROVIDER [facility]6/12/2021  9:00 AM CENTERING PROVIDER [facility]6/19/2023  9:00 AM CENTERING PROVIDER [facility]7/10/2021  9:00 AM CENTERING PROVIDER [facility]7/17/2023  9:00 AM CENTERING PROVIDER [facility]7/31/2023  9:00 AM CENTERING PROVIDER [facility] [place]  LiFatima BlankCNM

## 2021-10-09 ENCOUNTER — Telehealth: Payer: Self-pay

## 2021-10-09 ENCOUNTER — Ambulatory Visit: Payer: Medicaid Other | Admitting: Nurse Practitioner

## 2021-10-09 ENCOUNTER — Encounter: Payer: Medicaid Other | Admitting: Advanced Practice Midwife

## 2021-10-09 DIAGNOSIS — J4520 Mild intermittent asthma, uncomplicated: Secondary | ICD-10-CM

## 2021-10-09 DIAGNOSIS — T50902A Poisoning by unspecified drugs, medicaments and biological substances, intentional self-harm, initial encounter: Secondary | ICD-10-CM | POA: Insufficient documentation

## 2021-10-09 DIAGNOSIS — Z148 Genetic carrier of other disease: Secondary | ICD-10-CM

## 2021-10-09 DIAGNOSIS — F319 Bipolar disorder, unspecified: Secondary | ICD-10-CM | POA: Insufficient documentation

## 2021-10-09 DIAGNOSIS — Z3480 Encounter for supervision of other normal pregnancy, unspecified trimester: Secondary | ICD-10-CM

## 2021-10-09 NOTE — Telephone Encounter (Signed)
Called pt's mobile unable to leave message due message stating "try your call again later."  Called pt @ home number and pt informed me that she was unable to participate in Centering Pregnancy because she has a toddler that she would have to bring to her appts and she just wants the traditional way.  I explained to the pt that we will cancel her Centering Pregnancy appts and get her an appt scheduled with a provider the week of 11/01/21 and she will be able to get that appt via MyChart.  Pt verbalized understanding with on further questions.   Beverly Mckenzie  10/09/21

## 2021-10-10 ENCOUNTER — Ambulatory Visit: Payer: Medicaid Other | Admitting: Registered Nurse

## 2021-10-12 ENCOUNTER — Encounter: Payer: Self-pay | Admitting: Family Medicine

## 2021-10-18 ENCOUNTER — Encounter: Payer: Self-pay | Admitting: Family Medicine

## 2021-10-18 ENCOUNTER — Encounter: Payer: Self-pay | Admitting: *Deleted

## 2021-10-23 ENCOUNTER — Ambulatory Visit: Payer: Medicaid Other | Admitting: Obstetrics and Gynecology

## 2021-11-01 ENCOUNTER — Ambulatory Visit (INDEPENDENT_AMBULATORY_CARE_PROVIDER_SITE_OTHER): Payer: Medicaid Other | Admitting: Family Medicine

## 2021-11-01 ENCOUNTER — Other Ambulatory Visit: Payer: Self-pay

## 2021-11-01 VITALS — BP 107/71 | HR 75 | Wt 157.8 lb

## 2021-11-01 DIAGNOSIS — F319 Bipolar disorder, unspecified: Secondary | ICD-10-CM

## 2021-11-01 DIAGNOSIS — Z3480 Encounter for supervision of other normal pregnancy, unspecified trimester: Secondary | ICD-10-CM

## 2021-11-01 DIAGNOSIS — Z148 Genetic carrier of other disease: Secondary | ICD-10-CM

## 2021-11-01 DIAGNOSIS — D259 Leiomyoma of uterus, unspecified: Secondary | ICD-10-CM

## 2021-11-01 NOTE — Progress Notes (Signed)
? ?  PRENATAL VISIT NOTE ? ?Subjective:  ?Beverly Mckenzie is a 21 y.o. G2P1001 at 25w5dbeing seen today for ongoing prenatal care.  She is currently monitored for the following issues for this low-risk pregnancy and has Fibroid, uterine; Asthma; Genetic carrier; Indication for care in labor and delivery, antepartum; Vaginal delivery; Type 3a perineal laceration; Supervision of other normal pregnancy, antepartum; Suicide attempt by drug overdose (HDet[place]; and Bipolar 1 disorder ([place] on their problem list. ? ?Patient reports no complaints.  Contractions: Not present. Vag. Bleeding: None.  Movement: Present. Denies leaking of fluid.  ? ?The following portions of the patient's history were reviewed and updated as appropriate: allergies, current medications, past family history, past medical history, past social history, past surgical history and problem list.  ? ?Objective:  ? ?Vitals:  ? 11/01/21 1322  ?BP: 107/71  ?Pulse: 75  ?Weight: 157 lb 12.8 oz (71.6 kg)  ? ? ?Fetal Status: Fetal Heart Rate (bpm): 142   Movement: Present    ? ?General:  Alert, oriented and cooperative. Patient is in no acute distress.  ?Skin: Skin is warm and dry. No rash noted.   ?Cardiovascular: Normal heart rate noted  ?Respiratory: Normal respiratory effort, no problems with respiration noted  ?Abdomen: Soft, gravid, appropriate for gestational age.  Pain/Pressure: Absent     ?Pelvic: Cervical exam deferred        ?Extremities: Normal range of motion.  Edema: None  ?Mental Status: Normal mood and affect. Normal behavior. Normal judgment and thought content.  ? ?Assessment and Plan:  ?Pregnancy: G2P1001 at 144w5d1. Supervision of other normal pregnancy, antepartum ?FHT and FH normal ? ?2. Bipolar 1 disorder (HCLlano?Not currently on medication ? ?3. Uterine leiomyoma, unspecified location ? ?4. Genetic carrier ?Carrier for hurler syndrome and SMA. Partner testing not completed. Will check next appt. ? ?Preterm labor symptoms and general  obstetric precautions including but not limited to vaginal bleeding, contractions, leaking of fluid and fetal movement were reviewed in detail with the patient. ?Please refer to After Visit Summary for other counseling recommendations.  ? ?No follow-ups on file. ? ?Future Appointments  ?Date Time Provider DeDeport?11/27/2021  1:15 PM [facility] NURSE [facility]  ?11/27/2021  1:30 PM [facility] US3 [facility]US [facility]  ? ? ?JaTruett MainlandDO ?

## 2021-11-27 ENCOUNTER — Ambulatory Visit: Payer: Medicaid Other | Attending: Family Medicine

## 2021-11-27 ENCOUNTER — Encounter: Payer: Medicaid Other | Admitting: Obstetrics and Gynecology

## 2021-11-27 ENCOUNTER — Ambulatory Visit: Payer: Medicaid Other | Admitting: *Deleted

## 2021-11-27 ENCOUNTER — Other Ambulatory Visit: Payer: Self-pay | Admitting: Obstetrics and Gynecology

## 2021-11-27 VITALS — BP 106/62 | HR 68

## 2021-11-27 DIAGNOSIS — Z3A19 19 weeks gestation of pregnancy: Secondary | ICD-10-CM | POA: Diagnosis not present

## 2021-11-27 DIAGNOSIS — Z148 Genetic carrier of other disease: Secondary | ICD-10-CM | POA: Diagnosis not present

## 2021-11-27 DIAGNOSIS — O358XX0 Maternal care for other (suspected) fetal abnormality and damage, not applicable or unspecified: Secondary | ICD-10-CM | POA: Insufficient documentation

## 2021-11-27 DIAGNOSIS — J45909 Unspecified asthma, uncomplicated: Secondary | ICD-10-CM | POA: Diagnosis not present

## 2021-11-27 DIAGNOSIS — Z363 Encounter for antenatal screening for malformations: Secondary | ICD-10-CM | POA: Diagnosis not present

## 2021-11-27 DIAGNOSIS — O99512 Diseases of the respiratory system complicating pregnancy, second trimester: Secondary | ICD-10-CM | POA: Insufficient documentation

## 2021-11-27 DIAGNOSIS — Z362 Encounter for other antenatal screening follow-up: Secondary | ICD-10-CM | POA: Insufficient documentation

## 2021-11-27 DIAGNOSIS — Z3480 Encounter for supervision of other normal pregnancy, unspecified trimester: Secondary | ICD-10-CM | POA: Diagnosis not present

## 2021-11-29 ENCOUNTER — Encounter: Payer: Medicaid Other | Admitting: Certified Nurse Midwife

## 2021-12-25 ENCOUNTER — Ambulatory Visit: Payer: Medicaid Other

## 2021-12-26 ENCOUNTER — Ambulatory Visit: Payer: Medicaid Other

## 2022-01-01 ENCOUNTER — Encounter: Payer: Medicaid Other | Admitting: Obstetrics & Gynecology

## 2022-01-09 ENCOUNTER — Ambulatory Visit: Payer: Medicaid Other | Attending: Obstetrics and Gynecology

## 2022-01-09 ENCOUNTER — Ambulatory Visit: Payer: Medicaid Other | Admitting: *Deleted

## 2022-01-09 VITALS — BP 108/61 | HR 82

## 2022-01-09 DIAGNOSIS — Z362 Encounter for other antenatal screening follow-up: Secondary | ICD-10-CM | POA: Diagnosis not present

## 2022-01-09 DIAGNOSIS — Z3A25 25 weeks gestation of pregnancy: Secondary | ICD-10-CM

## 2022-01-09 DIAGNOSIS — O99512 Diseases of the respiratory system complicating pregnancy, second trimester: Secondary | ICD-10-CM

## 2022-01-09 DIAGNOSIS — D219 Benign neoplasm of connective and other soft tissue, unspecified: Secondary | ICD-10-CM | POA: Diagnosis present

## 2022-01-09 DIAGNOSIS — Z148 Genetic carrier of other disease: Secondary | ICD-10-CM | POA: Diagnosis not present

## 2022-01-09 DIAGNOSIS — J45909 Unspecified asthma, uncomplicated: Secondary | ICD-10-CM

## 2022-01-09 DIAGNOSIS — O285 Abnormal chromosomal and genetic finding on antenatal screening of mother: Secondary | ICD-10-CM | POA: Diagnosis not present

## 2022-01-10 ENCOUNTER — Other Ambulatory Visit: Payer: Self-pay | Admitting: *Deleted

## 2022-01-10 DIAGNOSIS — Z8759 Personal history of other complications of pregnancy, childbirth and the puerperium: Secondary | ICD-10-CM

## 2022-02-05 ENCOUNTER — Ambulatory Visit (INDEPENDENT_AMBULATORY_CARE_PROVIDER_SITE_OTHER): Payer: Medicaid Other

## 2022-02-05 VITALS — BP 109/71 | HR 77 | Wt 182.0 lb

## 2022-02-05 DIAGNOSIS — F319 Bipolar disorder, unspecified: Secondary | ICD-10-CM

## 2022-02-05 DIAGNOSIS — Z3480 Encounter for supervision of other normal pregnancy, unspecified trimester: Secondary | ICD-10-CM

## 2022-02-05 DIAGNOSIS — D259 Leiomyoma of uterus, unspecified: Secondary | ICD-10-CM

## 2022-02-05 DIAGNOSIS — Z3A29 29 weeks gestation of pregnancy: Secondary | ICD-10-CM

## 2022-02-05 NOTE — Progress Notes (Signed)
   PRENATAL VISIT NOTE  Subjective:  Beverly Mckenzie is a 21 y.o. G2P1001 at 24w3dbeing seen today for ongoing prenatal care.  She is currently monitored for the following issues for this low-risk pregnancy and has Fibroid, uterine; Asthma; Genetic carrier; Indication for care in labor and delivery, antepartum; Vaginal delivery; Type 3a perineal laceration; Supervision of other normal pregnancy, antepartum; Suicide attempt by drug overdose ([place]; and Bipolar 1 disorder ([place] on their problem list.  Patient reports no complaints.  Contractions: Irritability. Vag. Bleeding: None.  Movement: Present. Denies leaking of fluid.   The following portions of the patient's history were reviewed and updated as appropriate: allergies, current medications, past family history, past medical history, past social history, past surgical history and problem list.   Objective:   Vitals:   02/05/22 1621  BP: 109/71  Pulse: 77  Weight: 182 lb (82.6 kg)    Fetal Status: Fetal Heart Rate (bpm): 136 Fundal Height: 29 cm Movement: Present     General:  Alert, oriented and cooperative. Patient is in no acute distress.  Skin: Skin is warm and dry. No rash noted.   Cardiovascular: Normal heart rate noted  Respiratory: Normal respiratory effort, no problems with respiration noted  Abdomen: Soft, gravid, appropriate for gestational age.  Pain/Pressure: Absent     Pelvic: Cervical exam deferred        Extremities: Normal range of motion.  Edema: Trace  Mental Status: Normal mood and affect. Normal behavior. Normal judgment and thought content.   Assessment and Plan:  Pregnancy: G2P1001 at 267w3d. Supervision of other normal pregnancy, antepartum - Routine OB. Doing well, no concerns - Anticipatory guidance for upcoming appointments provided. Patient has not completed GTT and 28 week labs. Patient to have done asap - Patient considering postplacental Paragard. Information provided  2. Bipolar 1 disorder  (HCC) - No meds, mood stable  3. [redacted] weeks gestation of pregnancy - Endorses active fetal movement - FH appropriate  Preterm labor symptoms and general obstetric precautions including but not limited to vaginal bleeding, contractions, leaking of fluid and fetal movement were reviewed in detail with the patient. Please refer to After Visit Summary for other counseling recommendations.   Return in about 2 weeks (around 02/19/2022).  Future Appointments  Date Time Provider [place]6/26/2023  8:50 AM WMC-WOCA LAB [facility]6/26/2023  3:55 PM PiAletha HalimMD WMUniversity [facility] NorthMS[facility]7/12/2021  1:30 PM WMC-MFC US2 WMC-MFCUS WMV[facility]7/05/2022  1:15 PM PiAletha HalimMD WMUva Healthsouth Rehabilitation HospitalMLebonheur [facility] LP7/24/2023  1:15 PM FoJohnston EbbsNP WMVibra Hospital Of Western MassachusettsMS[facility]7/31/2023  1:15 PM DuRadene GunningMD WMEye Specialists [facility] Surgery [facility] IncMM[facility]8/02/2022  1:15 PM FoJohnston EbbsNP WMDanville Polyclinic LtdMEye [facility] Inc8/14/2023 11:15 AM FoJohnston EbbsNP WMEast [facility] Surgery CenterMSouth[facility]8/21/2023  1:35 PM WeKieth BrightlyMSmoke Ranch Surgery CenterMSum[facility] Surgery [facility] LLC8/28/2023  1:15 PM PiAletha HalimMD WMLebonheur East Surgery Center Ii LPMSurgery Center [facility] LP8/28/2023  2:15 PM WMC-WOCA NST WMEureka Community Health ServicesMRoswellCNM

## 2022-02-05 NOTE — Progress Notes (Signed)
Patient believe she is starting to have braxton kicks along with swelling of the feet

## 2022-02-19 ENCOUNTER — Other Ambulatory Visit: Payer: Self-pay

## 2022-02-19 ENCOUNTER — Ambulatory Visit (INDEPENDENT_AMBULATORY_CARE_PROVIDER_SITE_OTHER): Payer: Medicaid Other | Admitting: Obstetrics and Gynecology

## 2022-02-19 ENCOUNTER — Other Ambulatory Visit: Payer: Medicaid Other

## 2022-02-19 ENCOUNTER — Encounter: Payer: Self-pay | Admitting: Obstetrics and Gynecology

## 2022-02-19 VITALS — BP 114/72 | HR 82 | Wt 184.6 lb

## 2022-02-19 DIAGNOSIS — O0930 Supervision of pregnancy with insufficient antenatal care, unspecified trimester: Secondary | ICD-10-CM | POA: Insufficient documentation

## 2022-02-19 DIAGNOSIS — Z23 Encounter for immunization: Secondary | ICD-10-CM | POA: Diagnosis not present

## 2022-02-19 DIAGNOSIS — Z3A31 31 weeks gestation of pregnancy: Secondary | ICD-10-CM | POA: Diagnosis not present

## 2022-02-19 DIAGNOSIS — Z3480 Encounter for supervision of other normal pregnancy, unspecified trimester: Secondary | ICD-10-CM

## 2022-02-20 LAB — CBC
Hematocrit: 29.3 % — ABNORMAL LOW (ref 34.0–46.6)
Hemoglobin: 9.5 g/dL — ABNORMAL LOW (ref 11.1–15.9)
MCH: 26.3 pg — ABNORMAL LOW (ref 26.6–33.0)
MCHC: 32.4 g/dL (ref 31.5–35.7)
MCV: 81 fL (ref 79–97)
Platelets: 285 10*3/uL (ref 150–450)
RBC: 3.61 x10E6/uL — ABNORMAL LOW (ref 3.77–5.28)
RDW: 14.3 % (ref 11.7–15.4)
WBC: 9.1 10*3/uL (ref 3.4–10.8)

## 2022-02-20 LAB — GLUCOSE TOLERANCE, 2 HOURS W/ 1HR
Glucose, 1 hour: 123 mg/dL (ref 70–179)
Glucose, 2 hour: 93 mg/dL (ref 70–152)
Glucose, Fasting: 74 mg/dL (ref 70–91)

## 2022-02-20 LAB — RPR: RPR Ser Ql: NONREACTIVE

## 2022-02-20 LAB — HIV ANTIBODY (ROUTINE TESTING W REFLEX): HIV Screen 4th Generation wRfx: NONREACTIVE

## 2022-02-21 ENCOUNTER — Other Ambulatory Visit: Payer: Self-pay

## 2022-02-21 DIAGNOSIS — Z3480 Encounter for supervision of other normal pregnancy, unspecified trimester: Secondary | ICD-10-CM

## 2022-02-21 NOTE — Progress Notes (Signed)
Per Dr. Ilda Basset pt needs a f/u anemia panel added to blood draw.    Beverly Mckenzie  02/21/22

## 2022-02-22 LAB — ANEMIA PROFILE B
Basophils Absolute: 0.1 x10E3/uL (ref 0.0–0.2)
Basos: 1 %
EOS (ABSOLUTE): 0.2 x10E3/uL (ref 0.0–0.4)
Eos: 2 %
Ferritin: 9 ng/mL — ABNORMAL LOW (ref 15–150)
Folate: 12.7 ng/mL
Hematocrit: 29.5 % — ABNORMAL LOW (ref 34.0–46.6)
Hemoglobin: 9.3 g/dL — ABNORMAL LOW (ref 11.1–15.9)
Immature Grans (Abs): 0.1 x10E3/uL (ref 0.0–0.1)
Immature Granulocytes: 1 %
Iron Saturation: 7 % — CL (ref 15–55)
Iron: 39 ug/dL (ref 27–159)
Lymphocytes Absolute: 1.6 x10E3/uL (ref 0.7–3.1)
Lymphs: 16 %
MCH: 26.6 pg (ref 26.6–33.0)
MCHC: 31.5 g/dL (ref 31.5–35.7)
MCV: 85 fL (ref 79–97)
Monocytes Absolute: 0.6 x10E3/uL (ref 0.1–0.9)
Monocytes: 6 %
Neutrophils Absolute: 7.5 x10E3/uL — ABNORMAL HIGH (ref 1.4–7.0)
Neutrophils: 74 %
Platelets: 334 x10E3/uL (ref 150–450)
RBC: 3.49 x10E6/uL — ABNORMAL LOW (ref 3.77–5.28)
RDW: 14.7 % (ref 11.7–15.4)
Retic Ct Pct: 2.1 % (ref 0.6–2.6)
Total Iron Binding Capacity: 549 ug/dL — ABNORMAL HIGH (ref 250–450)
UIBC: 510 ug/dL — ABNORMAL HIGH (ref 131–425)
Vitamin B-12: 461 pg/mL (ref 232–1245)
WBC: 10 x10E3/uL (ref 3.4–10.8)

## 2022-02-22 LAB — SPECIMEN STATUS REPORT

## 2022-02-24 ENCOUNTER — Other Ambulatory Visit: Payer: Self-pay | Admitting: Obstetrics and Gynecology

## 2022-02-24 DIAGNOSIS — O99013 Anemia complicating pregnancy, third trimester: Secondary | ICD-10-CM | POA: Insufficient documentation

## 2022-02-26 ENCOUNTER — Telehealth: Payer: Self-pay | Admitting: General Practice

## 2022-02-26 NOTE — Telephone Encounter (Signed)
-----   Message from Aletha Halim, MD sent at 02/24/2022  3:29 PM EDT ----- Needs IV iron. Orders are in. thanks

## 2022-02-26 NOTE — Telephone Encounter (Signed)
Attempted to call Patient Mur[place], but office is currently closed due to the holiday. Called [facility] Short Stay, but unable to reach someone. Will try again later

## 2022-02-28 ENCOUNTER — Ambulatory Visit: Payer: Medicaid Other | Attending: Obstetrics

## 2022-02-28 DIAGNOSIS — J45909 Unspecified asthma, uncomplicated: Secondary | ICD-10-CM | POA: Diagnosis not present

## 2022-02-28 DIAGNOSIS — O99513 Diseases of the respiratory system complicating pregnancy, third trimester: Secondary | ICD-10-CM | POA: Diagnosis not present

## 2022-02-28 DIAGNOSIS — Z148 Genetic carrier of other disease: Secondary | ICD-10-CM

## 2022-02-28 DIAGNOSIS — O285 Abnormal chromosomal and genetic finding on antenatal screening of mother: Secondary | ICD-10-CM

## 2022-02-28 DIAGNOSIS — Z8759 Personal history of other complications of pregnancy, childbirth and the puerperium: Secondary | ICD-10-CM | POA: Insufficient documentation

## 2022-02-28 DIAGNOSIS — Z3A32 32 weeks gestation of pregnancy: Secondary | ICD-10-CM

## 2022-02-28 NOTE — Telephone Encounter (Signed)
Scheduled venofer infusion for 7/17 @ 9am with patient care center. Called patient and discussed recommendation of iron infusion and reviewed appt information. Patient verbalized understanding.

## 2022-03-05 ENCOUNTER — Telehealth (INDEPENDENT_AMBULATORY_CARE_PROVIDER_SITE_OTHER): Payer: Medicaid Other | Admitting: Obstetrics and Gynecology

## 2022-03-05 DIAGNOSIS — Z3A33 33 weeks gestation of pregnancy: Secondary | ICD-10-CM

## 2022-03-05 DIAGNOSIS — O99013 Anemia complicating pregnancy, third trimester: Secondary | ICD-10-CM | POA: Diagnosis not present

## 2022-03-05 NOTE — Progress Notes (Signed)
    TELEHEALTH OBSTETRICS VISIT ENCOUNTER NOTE  Provider location: Center for [place] [facility] [place] [facility]   Patient location: Home  I connected with Natalyn Szymanowski on 03/05/22 at  1:15 PM EDT by telephone at home and verified that I am speaking with the correct person using two identifiers. Of note, unable to do video encounter due to technical difficulties.    I discussed the limitations, risks, security and privacy concerns of performing an evaluation and management service by telephone and the availability of in person appointments. I also discussed with the patient that there may be a patient responsible charge related to this service. The patient expressed understanding and agreed to proceed.  Subjective:  Beverly Mckenzie is a 21 y.o. G2P1001 at 75w3dbeing followed for ongoing prenatal care.  She is currently monitored for the following issues for this low-risk pregnancy and has Asthma; Genetic carrier; Supervision of other normal pregnancy, antepartum; Suicide attempt by drug overdose ([place]; Bipolar 1 disorder (HE[place]; Insufficient prenatal care; and Anemia affecting pregnancy in third trimester on their problem list.  Patient reports occasional contractions. Reports fetal movement. Denies any contractions, bleeding or leaking of fluid.   The following portions of the patient's history were reviewed and updated as appropriate: allergies, current medications, past family history, past medical history, past social history, past surgical history and problem list.   Objective:  Last menstrual period 07/14/2021, not currently breastfeeding. General:  Alert, oriented and cooperative.   Mental Status: Normal mood and affect perceived. Normal judgment and thought content.  Rest of physical exam deferred due to type of encounter  Assessment and Plan:  Pregnancy: G2P1001 at 392w3d. [redacted] weeks gestation of pregnancy Paragard. Surveillance growth normal last week>>repeat  PRN  2. Anemia affecting pregnancy in third trimester Confirms IV iron appt next week.   Preterm labor symptoms and general obstetric precautions including but not limited to vaginal bleeding, contractions, leaking of fluid and fetal movement were reviewed in detail with the patient.  I discussed the assessment and treatment plan with the patient. The patient was provided an opportunity to ask questions and all were answered. The patient agreed with the plan and demonstrated an understanding of the instructions. The patient was advised to call back or seek an in-person office evaluation/[facility]or any urgent or concerning symptoms. Please refer to After Visit Summary for other counseling recommendations.   I provided 7 minutes of non-face-to-face time during this encounter.  No follow-ups on file.  Future Appointments  Date Time Provider [place]7/17/2023  9:00 AM WL-SCAC RM 1 WL-SCAC None  03/19/2022  1:15 PM FoJohnston EbbsNP WMCalifornia Rehabilitation Institute, LLCMMercy [facility]7/31/2023  1:15 PM DuRadene GunningMD WMAurora Vista Del Mar HospitalMPorter Medical Center[facility] Inc.04/02/2022  1:15 PM FoJohnston EbbsNP WMUniversity Of Maryland [facility] LLCMBismarck Surgical [facility] LLC8/14/2023 11:15 AM FoJohnston EbbsNP WMNorthwest Florida Gastroenterology CenterM[facility]8/21/2023  1:35 PM WeKieth BrightlyMSt Augustine Endoscopy Center LLCMSchulze Surgery Center Inc8/28/2023  1:15 PM PiAletha HalimMD WMDallas County HospitalMSt [facility]8/28/2023  2:15 PM WMC-WOCA NST WMSelect Specialty Hospital GainesvilleMAustinMD Center for WoDean Foods CompanyCoCottonwood

## 2022-03-12 ENCOUNTER — Encounter (HOSPITAL_COMMUNITY): Payer: Medicaid Other

## 2022-03-19 ENCOUNTER — Telehealth: Payer: Self-pay

## 2022-03-19 ENCOUNTER — Telehealth: Payer: Medicaid Other | Admitting: Student

## 2022-03-19 NOTE — Telephone Encounter (Signed)
First attempt to call pt to start virtual appointment. Pt did not answer and I was unable to leave a VM.  Brailyn Delman, CMA   1:19pm

## 2022-03-19 NOTE — Telephone Encounter (Signed)
Second attempt to call patient to start virtual appointment but she did not answer. I was unable to leave a message.  Chuong Casebeer, CMA

## 2022-03-22 ENCOUNTER — Other Ambulatory Visit (HOSPITAL_COMMUNITY)
Admission: RE | Admit: 2022-03-22 | Discharge: 2022-03-22 | Disposition: A | Discharge status: Home / Self Care | Payer: Medicaid Other | Source: Ambulatory Visit | Attending: Family Medicine | Admitting: Family Medicine

## 2022-03-22 ENCOUNTER — Ambulatory Visit (INDEPENDENT_AMBULATORY_CARE_PROVIDER_SITE_OTHER): Payer: Medicaid Other

## 2022-03-22 ENCOUNTER — Other Ambulatory Visit: Payer: Self-pay

## 2022-03-22 VITALS — BP 110/73 | HR 90 | Wt 188.0 lb

## 2022-03-22 DIAGNOSIS — N898 Other specified noninflammatory disorders of vagina: Secondary | ICD-10-CM | POA: Diagnosis not present

## 2022-03-22 NOTE — Progress Notes (Signed)
Patient is here today with complaint of vaginal itching and irritation. I instructed patient on how to collect a self swab. Self swab collected without issue. I explained to patient we will notify her with any abnormal results. Patient verbalized understanding and denies any other questions. I reviewed patient's next prenatal appointment date and time with her.   Paulina Fusi, RN 03/22/22

## 2022-03-23 ENCOUNTER — Encounter: Payer: Self-pay | Admitting: Obstetrics and Gynecology

## 2022-03-23 LAB — CERVICOVAGINAL ANCILLARY ONLY
Bacterial Vaginitis (gardnerella): NEGATIVE
Candida Glabrata: NEGATIVE
Candida Vaginitis: POSITIVE — AB
Chlamydia: NEGATIVE
Comment: NEGATIVE
Comment: NEGATIVE
Comment: NEGATIVE
Comment: NEGATIVE
Comment: NEGATIVE
Comment: NORMAL
Neisseria Gonorrhea: NEGATIVE
Trichomonas: NEGATIVE

## 2022-03-23 NOTE — Progress Notes (Unsigned)
   PRENATAL VISIT NOTE  Subjective:  Beverly Mckenzie is a 21 y.o. G2P1001 at [redacted]w[redacted]d being seen today for ongoing prenatal care.  She is currently monitored for the following issues for this low-risk pregnancy and has Asthma; Genetic carrier; Supervision of other normal pregnancy, antepartum; Suicide attempt by drug overdose ([place]); Bipolar 1 disorder ([employer]); Insufficient prenatal care; and Anemia affecting pregnancy in third trimester on their problem list.  Patient reports no complaints.  Contractions: Irritability. Vag. Bleeding: None.  Movement: Present. Denies leaking of fluid.   The following portions of the patient's history were reviewed and updated as appropriate: allergies, current medications, past family history, past medical history, past social history, past surgical history and problem list.   Objective:   Vitals:   03/26/22 1324  BP: 116/77  Pulse: 97  Weight: 190 lb 12.8 oz (86.5 kg)    Fetal Status: Fetal Heart Rate (bpm): 140 Fundal Height: 36 cm Movement: Present  Presentation: Vertex  General:  Alert, oriented and cooperative. Patient is in no acute distress.  Skin: Skin is warm and dry. No rash noted.   Cardiovascular: Normal heart rate noted  Respiratory: Normal respiratory effort, no problems with respiration noted  Abdomen: Soft, gravid, appropriate for gestational age.  Pain/Pressure: Present     Pelvic: Cervical exam performed in the presence of a chaperone Dilation: 1 Effacement (%): 50 Station: -3  Extremities: Normal range of motion.  Edema: Trace  Mental Status: Normal mood and affect. Normal behavior. Normal judgment and thought content.   Assessment and Plan:  Pregnancy: G2P1001 at [redacted]w[redacted]d 1. Anemia affecting pregnancy in third trimester Had IV iron scheduled 7/17 - she was unable to go due to family issues.  Discussed PO iron as an alternative. Reviewed every other day medication and that insurance may not cover. She would like to do PO iron.   2.  Insufficient prenatal care in third trimester  3. Bipolar 1 disorder (HCC) No meds  4. Supervision of other normal pregnancy, antepartum GBS done today GC/CT negative in MAU 3 days ago.   5. 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   Preterm labor symptoms and general obstetric precautions including but not limited to vaginal bleeding, contractions, leaking of fluid and fetal movement were reviewed in detail with the patient. Please refer to After Visit Summary for other counseling recommendations.   Return in about 1 week (around 04/02/2022) for OB VISIT, MD or APP.  Future Appointments  Date Time Provider [place]  04/03/2022 10:55 AM Caren Macadam, MD [facility]  04/09/2022 11:15 AM Johnston Ebbs, NP [facility]  04/16/2022  1:35 PM Kieth Brightly [facility]  04/23/2022  1:15 PM Aletha Halim, MD [facility]  04/23/2022  2:15 PM WMC-WOCA NST [employer] [facility]    Radene Gunning, MD

## 2022-03-26 ENCOUNTER — Ambulatory Visit (INDEPENDENT_AMBULATORY_CARE_PROVIDER_SITE_OTHER): Payer: Medicaid Other | Admitting: Obstetrics and Gynecology

## 2022-03-26 ENCOUNTER — Encounter: Payer: Self-pay | Admitting: Obstetrics and Gynecology

## 2022-03-26 ENCOUNTER — Other Ambulatory Visit: Payer: Self-pay

## 2022-03-26 VITALS — BP 116/77 | HR 97 | Wt 190.8 lb

## 2022-03-26 DIAGNOSIS — O99013 Anemia complicating pregnancy, third trimester: Secondary | ICD-10-CM

## 2022-03-26 DIAGNOSIS — Z3480 Encounter for supervision of other normal pregnancy, unspecified trimester: Secondary | ICD-10-CM

## 2022-03-26 DIAGNOSIS — F319 Bipolar disorder, unspecified: Secondary | ICD-10-CM

## 2022-03-26 DIAGNOSIS — O0933 Supervision of pregnancy with insufficient antenatal care, third trimester: Secondary | ICD-10-CM

## 2022-03-26 DIAGNOSIS — Z148 Genetic carrier of other disease: Secondary | ICD-10-CM

## 2022-03-26 MED ORDER — FERROUS SULFATE 325 (65 FE) MG PO TABS: 325.0000 mg | ORAL_TABLET | ORAL | 1 refills | Status: AC

## 2022-03-30 LAB — CULTURE, BETA STREP (GROUP B ONLY): Strep Gp B Culture: NEGATIVE

## 2022-04-02 ENCOUNTER — Other Ambulatory Visit: Payer: Self-pay

## 2022-04-02 ENCOUNTER — Encounter (HOSPITAL_COMMUNITY): Payer: Self-pay | Admitting: Obstetrics & Gynecology

## 2022-04-02 ENCOUNTER — Inpatient Hospital Stay (HOSPITAL_COMMUNITY)
Admission: AD | Admit: 2022-04-02 | Discharge: 2022-04-02 | Disposition: A | Discharge status: Home / Self Care | Payer: Medicaid Other | Attending: Obstetrics & Gynecology | Admitting: Obstetrics & Gynecology

## 2022-04-02 ENCOUNTER — Telehealth: Payer: Self-pay | Admitting: Student

## 2022-04-02 DIAGNOSIS — Z3A37 37 weeks gestation of pregnancy: Secondary | ICD-10-CM

## 2022-04-02 DIAGNOSIS — O471 False labor at or after 37 completed weeks of gestation: Secondary | ICD-10-CM | POA: Diagnosis present

## 2022-04-02 DIAGNOSIS — O479 False labor, unspecified: Secondary | ICD-10-CM | POA: Diagnosis not present

## 2022-04-02 NOTE — MAU Note (Signed)
.  Beverly Mckenzie is a 21 y.o. at 70w3dhere in MAU reporting: ctx 10 mins since 0222. Pt reports mucus discharge. Pt denies DFM, VB, LOF, PIH s/s, recent intercourse, and complications in the pregnancy. GBS neg SVE last Monday 1.5 cm Anema    Onset of complaint: 0222 Pain score: 8/10 Vitals:   04/02/22 0615  BP: 118/72  Pulse: 85  Resp: 18  Temp: 97.8 F (36.6 C)  SpO2: 100%     FHT:148 Lab orders placed from triage:  none

## 2022-04-02 NOTE — MAU Provider Note (Signed)
Fransisca Shawn is a 21 y.o. G93P1001 female at [redacted]w[redacted]d RN Labor check, not seen by provider SVE by RN: Dilation: 1.5 Effacement (%): 60 Station: -2 Exam by:: HSunTrustRN NST: FHR baseline 140 bpm, Variability: moderate, Accelerations:present, Decelerations:  Absent= Cat 1/Reactive Toco: irritability   D/C home  JConcepcion LivingMD 04/02/2022 9:13 AM

## 2022-04-03 ENCOUNTER — Encounter (HOSPITAL_COMMUNITY): Payer: Self-pay | Admitting: Family Medicine

## 2022-04-03 ENCOUNTER — Inpatient Hospital Stay (HOSPITAL_COMMUNITY)
Admission: AD | Admit: 2022-04-03 | Discharge: 2022-04-03 | Disposition: A | Discharge status: Home / Self Care | Payer: Medicaid Other | Attending: Family Medicine | Admitting: Family Medicine

## 2022-04-03 ENCOUNTER — Encounter: Payer: Self-pay | Admitting: Family Medicine

## 2022-04-03 ENCOUNTER — Telehealth (INDEPENDENT_AMBULATORY_CARE_PROVIDER_SITE_OTHER): Payer: Medicaid Other | Admitting: Family Medicine

## 2022-04-03 DIAGNOSIS — O0933 Supervision of pregnancy with insufficient antenatal care, third trimester: Secondary | ICD-10-CM

## 2022-04-03 DIAGNOSIS — Z3493 Encounter for supervision of normal pregnancy, unspecified, third trimester: Secondary | ICD-10-CM | POA: Insufficient documentation

## 2022-04-03 DIAGNOSIS — Z3A37 37 weeks gestation of pregnancy: Secondary | ICD-10-CM

## 2022-04-03 DIAGNOSIS — F319 Bipolar disorder, unspecified: Secondary | ICD-10-CM

## 2022-04-03 DIAGNOSIS — Z3689 Encounter for other specified antenatal screening: Secondary | ICD-10-CM | POA: Insufficient documentation

## 2022-04-03 DIAGNOSIS — Z3480 Encounter for supervision of other normal pregnancy, unspecified trimester: Secondary | ICD-10-CM

## 2022-04-03 DIAGNOSIS — O99013 Anemia complicating pregnancy, third trimester: Secondary | ICD-10-CM

## 2022-04-03 DIAGNOSIS — O479 False labor, unspecified: Secondary | ICD-10-CM | POA: Diagnosis not present

## 2022-04-03 DIAGNOSIS — O99343 Other mental disorders complicating pregnancy, third trimester: Secondary | ICD-10-CM

## 2022-04-03 LAB — POCT FERN TEST: POCT Fern Test: NEGATIVE

## 2022-04-03 NOTE — MAU Provider Note (Signed)
S: Beverly Mckenzie is a 21 y.o. G2P1001 at [redacted]w[redacted]d who presents to MAU today complaining of leaking of fluid since 1045pm. She denies vaginal bleeding. She endorses contractions. She reports normal fetal movement.    O: BP 112/74 (BP Location: Right Arm)   Pulse 91   Temp 98.1 F (36.7 C) (Oral)   Resp 18   Ht 5' 7.5" (1.715 m)   Wt 192 lb 12.8 oz (87.5 kg)   LMP 07/14/2021   BMI 29.75 kg/m  GENERAL: Well-developed, well-nourished female in no acute distress.  HEAD: Normocephalic, atraumatic.  CHEST: Normal effort of breathing, regular heart rate ABDOMEN: Soft, nontender, gravid PELVIC: Normal external female genitalia. Vagina is pink and rugated. Cervix with normal contour, no lesions. Normal discharge.  No pooling.   Cervical exam: (unchanged since last exam) Dilation: 2 Effacement (%): 70 Cervical Position: Posterior Station: -2 Presentation: Vertex Exam by:: PHuston FoleyRN  Fetal Monitoring: reactive Baseline: 125 Variability: moderate Accelerations: 15x15 Decelerations: none Contractions: UI with strong UC >149m apart  Results for orders placed or performed during the hospital encounter of 04/03/22 (from the past 24 hour(s))  POCT fern test     Status: Normal   Collection Time: 04/03/22  3:54 AM  Result Value Ref Range   POCT Fern Test Negative = intact amniotic membranes   Fern test negative x2, second via speculum exam  A: SIUP at 3743w4dembranes intact  P: Discharge home in stable condition with term labor precautions [facility] for ongoing prenatal care  WalGabriel CarinaNM 04/03/2022 3:55 AM

## 2022-04-03 NOTE — Progress Notes (Unsigned)
I connected with  Beverly Mckenzie on 04/03/22 at 10:55 AM EDT by MyChart Virtual Video Visit and verified that I am speaking with the correct person using two identifiers.   I discussed the limitations, risks, security and privacy concerns of performing an evaluation and management service by telephone and the availability of in person appointments. I also discussed with the patient that there may be a patient responsible charge related to this service. The patient expressed understanding and agreed to proceed.  Mena Goes, CMA 04/03/2022  11:09 AM

## 2022-04-03 NOTE — MAU Note (Signed)
Pt says at 1045- she was laying and fluid started running down her legs- and happened again at [place] strong and reg UC's VE- yesterday - 2 cm in MAU  [facility]- clinic Denies HSV GBS- neg

## 2022-04-03 NOTE — Progress Notes (Unsigned)
OBSTETRICS PRENATAL VIRTUAL VISIT ENCOUNTER NOTE  Provider location: Center for [place] at [place] [facility]   Patient location: Home  I connected with [name] on 04/04/22 at 10:55 AM EDT by MyChart Video Encounter and verified that I am speaking with the correct person using two identifiers. I discussed the limitations, risks, security and privacy concerns of performing an evaluation and management service virtually and the availability of in person appointments. I also discussed with the patient that there may be a patient responsible charge related to this service. The patient expressed understanding and agreed to proceed.  Subjective:  Beverly Mckenzie is a 21 y.o. G2P1001 at 80w4dbeing seen today for ongoing prenatal care.  She is currently monitored for the following issues for this high-risk pregnancy and has Asthma; Genetic carrier; Supervision of other normal pregnancy, antepartum; Suicide attempt by drug overdose ([place]; Bipolar 1 disorder ([place]Log[place]; Insufficient prenatal care; and Anemia affecting pregnancy in third trimester on their problem list.  Patient reports  strong contractions every 15 minutes, went to MAU 8/7 in the early AM, hands and feet are swollen. Reports discomfort and cramping between contractions. Reports pressure in pelvis .  Contractions: Irritability.  .  Movement: Present. Denies any leaking of fluid.   The following portions of the patient's history were reviewed and updated as appropriate: allergies, current medications, past family history, past medical history, past social history, past surgical history and problem list.   Objective:  There were no vitals filed for this visit.  Fetal Status:     Movement: Present     General:  Alert, oriented and cooperative. Patient is in no acute distress.  Respiratory: Normal respiratory effort, no problems with respiration noted  Mental Status: Normal mood and affect. Normal behavior. Normal  judgment and thought content.  Rest of physical exam deferred due to type of encounter  Imaging: No results found.  Assessment and Plan:  Pregnancy: G2P1001 at 317w4d. Anemia affecting pregnancy in third trimester Last HGB 9.3 On PO iron  2. Insufficient prenatal care in third trimester No care from 11-29 weeks  3. Bipolar 1 disorder (HCCarlin 4. Supervision of other normal pregnancy, antepartum GBS/GC/CT negative TWG=42 lb 12.8 oz (19.4 kg) which is above goal Patient appears by her sx to be in prodromal labor. She did not have strong ctx while on video with me today. DIscussed when to return to the hospital  Patient does not want birth control because they "mess with her hormones" and she "is not going to be having sex." We discussed her goal of spacing pregnancies or possibly not having another baby. I reviewed that with unprotected sex [id number] will get pregnant and our goal it to help her reach her goals. She brought up the Cu-IUD and she might be interested in this in the future.   Term labor symptoms and general obstetric precautions including but not limited to vaginal bleeding, contractions, leaking of fluid and fetal movement were reviewed in detail with the patient.  I discussed the assessment and treatment plan with the patient. The patient was provided an opportunity to ask questions and all were answered. The patient agreed with the plan and demonstrated an understanding of the instructions. The patient was advised to call back or seek an in-person office evaluation/[facility]or any urgent or concerning symptoms. Please refer to After Visit Summary for other counseling recommendations.   I provided 20 minutes of face-to-face  time during this encounter.  Return in about 1 week (around 04/10/2022) for Routine prenatal care.  Future Appointments  Date Time Provider [place]  04/09/2022 11:15 AM Johnston Ebbs, NP [facility]   04/16/2022  1:35 PM Kieth Brightly [facility]  04/23/2022  1:15 PM Aletha Halim, MD [facility]  04/23/2022  2:15 PM WMC-WOCA NST [facility]    Caren Macadam, [place] for [employer], [place] [employer]

## 2022-04-04 ENCOUNTER — Encounter (HOSPITAL_COMMUNITY): Payer: Self-pay | Admitting: Obstetrics and Gynecology

## 2022-04-04 ENCOUNTER — Encounter: Payer: Self-pay | Admitting: Family Medicine

## 2022-04-04 ENCOUNTER — Inpatient Hospital Stay (HOSPITAL_COMMUNITY)
Admission: AD | Admit: 2022-04-04 | Discharge: 2022-04-04 | Disposition: A | Discharge status: Home / Self Care | Payer: Medicaid Other | Attending: Obstetrics and Gynecology | Admitting: Obstetrics and Gynecology

## 2022-04-04 DIAGNOSIS — O479 False labor, unspecified: Secondary | ICD-10-CM

## 2022-04-04 DIAGNOSIS — O26893 Other specified pregnancy related conditions, third trimester: Secondary | ICD-10-CM | POA: Insufficient documentation

## 2022-04-04 DIAGNOSIS — Z3A37 37 weeks gestation of pregnancy: Secondary | ICD-10-CM | POA: Diagnosis not present

## 2022-04-04 DIAGNOSIS — R109 Unspecified abdominal pain: Secondary | ICD-10-CM | POA: Insufficient documentation

## 2022-04-04 DIAGNOSIS — O471 False labor at or after 37 completed weeks of gestation: Secondary | ICD-10-CM | POA: Insufficient documentation

## 2022-04-04 MED ORDER — OXYCODONE-ACETAMINOPHEN 5-325 MG PO TABS
2.0000 | ORAL_TABLET | Freq: Once | ORAL | Status: AC
  Administered 2022-04-04: 2 via ORAL
  Filled 2022-04-04: qty 2

## 2022-04-04 NOTE — MAU Provider Note (Signed)
Chief Complaint:  Contractions   None    HPI: Beverly Mckenzie is a 21 y.o. G2P1001 at 55w5dho presents to maternity admissions reporting painful contractions since 1500. .   . She reports good fetal movement, denies LOF, vaginal bleeding, vaginal itching/burning, urinary symptoms, h/a, dizziness, n/v, diarrhea, constipation or fever/chills.   Abdominal Pain This is a new problem. The current episode started today. The onset quality is gradual. The problem occurs intermittently. The problem is unchanged. The quality of the pain is described as cramping. The pain radiates to the back. Pertinent negatives include no constipation, diarrhea, dysuria, fever, frequency or nausea. Nothing relieves the symptoms. Past treatments include nothing.    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    Past obstetric history: OB History  Gravida Para Term Preterm AB Living  '2 1 1     1  '$ SAB IAB Ectopic Multiple Live Births        0 1    # Outcome Date GA Lbr Len/2nd Weight Sex Delivery Anes PTL Lv  2 Current           1 Term 08/23/20 411w0d1:35 / 01:04 3300 g M Vag-Spont EPI  LIV    Past Surgical History: Past Surgical History:  Procedure Laterality Date   NO PAST SURGERIES      Family History: Family History  Problem Relation Age of Onset   Healthy Mother    Healthy Father     Social History: Social History   Tobacco Use   Smoking status: Never   Smokeless tobacco: Never  Vaping Use   Vaping Use: Never used  Substance Use Topics   Alcohol use: No   Drug use: No    Allergies:  Allergies  Allergen Reactions   Justicia Adhatoda (Malabar Nut Tree) [Justicia Adhatoda] Anaphylaxis and Hives    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 90 tablet 1 04/03/2022   Prenatal Vit-Fe Fumarate-FA (MULTIVITAMIN-PRENATAL) 27-0.8 MG TABS tablet Take 1 tablet by mouth daily at 12 noon.   04/04/2022    I have reviewed patient's Past Medical Hx, Surgical Hx, Family Hx, Social Hx, medications and allergies.   ROS:  Review of Systems  Constitutional:  Negative for fever.  Gastrointestinal:  Positive for abdominal pain. Negative for constipation, diarrhea and nausea.  Genitourinary:  Negative for dysuria and frequency.   Other systems negative  Physical Exam  Patient Vitals for the past 24 hrs:  BP Temp Temp src Pulse Resp SpO2  04/04/22 2146 -- -- -- -- 18 --  04/04/22 2108 115/75 -- -- 85 -- --  04/04/22 1902 117/74 98.1 F (36.7 C) Oral 82 18 100 %   Constitutional: Well-developed, well-nourished female in no acute distress.  Cardiovascular: normal rate and rhythm Respiratory: normal effort, clear to auscultation bilaterally GI: Abd soft, non-tender, gravid appropriate for gestational age.   No rebound or guarding. MS: Extremities nontender, no edema, normal ROM Neurologic: Alert and oriented x 4.  GU: Neg CVAT.  PELVIC  EXAM:  Dilation: 3 Effacement (%): 70 Cervical Position: Posterior Station: -3 Presentation: Vertex Exam by:: Youlanda Roys, RN  Rechecked twice over 2 hours and there was no change in cervix  FHT:  Baseline 140 , moderate variability, accelerations present, no decelerations Contractions: q 2-5 mins Irregular    Labs: No results found for this or any previous visit (from the past 24 hour(s)). A/Positive/-- (02/08 1408)  Imaging:  No results found.  MAU Course/MDM: I have reviewed the triage vital signs and the nursing notes.   Pertinent labs & imaging results that were available during my care of the patient were reviewed by me and  considered in my medical decision making (see chart for details).      I have reviewed her medical records including past results, notes and treatments.   I have ordered labs and reviewed results.  NST reviewed  Treatments in MAU included EFM.    Assessment: 1. Uterine contractions during pregnancy   2. False labor   3. [redacted] weeks gestation of pregnancy     Plan: Discharge home Labor precautions and fetal kick counts Follow up in Office for prenatal visits and recheck  [place] for [place] at [facility] Follow up.   Specialty: Obstetrics and Gynecology Contact information: [place] [id number] [phone]                Pt stable at time of discharge.  Hansel Feinstein CNM, MSN Certified Nurse-Midwife 04/04/2022 9:49 PM

## 2022-04-04 NOTE — MAU Note (Signed)
Beverly Mckenzie is a 21 y.o. at 83w5dhere in MAU reporting: ctx that started around 1500 today 04/04/2022. Pt states the ctx are every 2-3 minutes. Pt denies VB or LOF. +FM   Onset of complaint: 04/04/2022 Pain score: 9/10 Vitals:   04/04/22 1902  BP: 117/74  Pulse: 82  Resp: 18  Temp: 98.1 F (36.7 C)  SpO2: 100%     FHT: 142 Lab orders placed from triage:

## 2022-04-06 ENCOUNTER — Inpatient Hospital Stay (HOSPITAL_COMMUNITY)
Admission: AD | Admit: 2022-04-06 | Discharge: 2022-04-06 | Disposition: A | Discharge status: Home / Self Care | Payer: Medicaid Other | Attending: Obstetrics & Gynecology | Admitting: Obstetrics & Gynecology

## 2022-04-06 ENCOUNTER — Encounter (HOSPITAL_COMMUNITY): Payer: Self-pay | Admitting: Obstetrics & Gynecology

## 2022-04-06 DIAGNOSIS — Z3A38 38 weeks gestation of pregnancy: Secondary | ICD-10-CM | POA: Diagnosis present

## 2022-04-06 DIAGNOSIS — O471 False labor at or after 37 completed weeks of gestation: Secondary | ICD-10-CM | POA: Diagnosis not present

## 2022-04-06 MED ORDER — BUTORPHANOL TARTRATE 2 MG/ML IJ SOLN
1.0000 mg | Freq: Once | INTRAMUSCULAR | Status: AC
  Administered 2022-04-06: 1 mg via INTRAMUSCULAR
  Filled 2022-04-06: qty 1

## 2022-04-06 MED ORDER — PROMETHAZINE HCL 25 MG/ML IJ SOLN
12.5000 mg | Freq: Once | INTRAMUSCULAR | Status: AC
Start: 2022-04-06 — End: 2022-04-06
  Administered 2022-04-06: 12.5 mg via INTRAMUSCULAR
  Filled 2022-04-06: qty 1

## 2022-04-06 NOTE — MAU Note (Signed)
Beverly Mckenzie is a 21 y.o. at 26w0dhere in MAU reporting: been contracting since last night, now are every 2-3 minutes.  Has been losing a lot of her mucous plug over the last 2 days.  No leaking or bleeding.  +FM reported, is "really fatigued and in pain"  was 3 when she was last checked  Onset of complaint: yesterday Pain score: severe Vitals:   04/06/22 1314  BP: 121/83  Pulse: 88  Resp: 18  Temp: 98 F (36.7 C)  SpO2: 100%     FHT:144 Lab orders placed from triage:  none

## 2022-04-06 NOTE — MAU Provider Note (Signed)
S: [name] is a 21 y.o. G2P1001 at [redacted]w[redacted]d who presents to MAU today for labor evaluation.     Cervical exam by RN:  Dilation: 3 Effacement (%): 70 Cervical Position: Posterior Station: -3 Presentation: Vertex Exam by:: K.Wilson,RN  Fetal Monitoring: Baseline: 130 Variability: mod Accelerations: present Decelerations: absent Contractions: irregular   MDM Discussed patient with RN. NST reviewed.   A: SIUP at 383w0dFalse labor  P: Discharge home Labor precautions and kick counts included in AVS [facility]  Patient may return to MAU as needed or when in labor   KoStarr LakeCNSutton/06/2022 4:00 PM  KaMervyn Skeetersooistra 04/06/2022, 3:59 PM

## 2022-04-09 ENCOUNTER — Telehealth: Payer: Self-pay | Admitting: Family Medicine

## 2022-04-09 ENCOUNTER — Telehealth (INDEPENDENT_AMBULATORY_CARE_PROVIDER_SITE_OTHER): Payer: Medicaid Other | Admitting: Student

## 2022-04-09 ENCOUNTER — Encounter: Payer: Medicaid Other | Admitting: Student

## 2022-04-09 DIAGNOSIS — Z3483 Encounter for supervision of other normal pregnancy, third trimester: Secondary | ICD-10-CM

## 2022-04-09 DIAGNOSIS — Z3480 Encounter for supervision of other normal pregnancy, unspecified trimester: Secondary | ICD-10-CM

## 2022-04-09 DIAGNOSIS — Z3A38 38 weeks gestation of pregnancy: Secondary | ICD-10-CM

## 2022-04-09 NOTE — Progress Notes (Signed)
Pt reports that she is in the processmof moving and her BP Cuff is in storage.

## 2022-04-09 NOTE — Progress Notes (Signed)
OBSTETRICS PRENATAL VIRTUAL VISIT ENCOUNTER NOTE  Provider location: Center for [place] [facility] [place] [facility]   Patient location: Home  I connected with Beverly Mckenzie on 04/09/22 at 10:35 AM EDT by MyChart Video Encounter and verified that I am speaking with the correct person using two identifiers. I discussed the limitations, risks, security and privacy concerns of performing an evaluation and management service virtually and the availability of in person appointments. I also discussed with the patient that there may be a patient responsible charge related to this service. The patient expressed understanding and agreed to proceed. Subjective:  Beverly Mckenzie is a 21 y.o. G2P1001 at 15w3dbeing seen today for ongoing prenatal care.  She is currently monitored for the following issues for this high-risk pregnancy and has Asthma; Genetic carrier; Supervision of other normal pregnancy, antepartum; Suicide attempt by drug overdose ([place]; Bipolar 1 disorder ([place]; Insufficient prenatal care; and Anemia affecting pregnancy in third trimester on their problem list.  Patient reports occasional contractions and peripheral edema in legs and hands. Denies headache, chest pain, difficulty breathing, epigastric pain or n/v.  Contractions: Irritability. Vag. Bleeding: None.  Movement: Present. Denies any leaking of fluid.   The following portions of the patient's history were reviewed and updated as appropriate: allergies, current medications, past family history, past medical history, past social history, past surgical history and problem list.   Objective:  There were no vitals filed for this visit.  Fetal Status:     Movement: Present     General:  Alert, oriented and cooperative. Patient is in no acute distress.  Respiratory: Normal respiratory effort, no problems with respiration noted  Mental Status: Normal mood and affect. Normal behavior. Normal judgment and thought content.   Rest of physical exam deferred due to type of encounter  Imaging: No results found.  Assessment and Plan:  Pregnancy: G2P1001 at 351w3d. Supervision of other normal pregnancy, antepartum - Reports vigorous fetal movement - Strongly encouraged patient to obtain BP check today or tomorrow. Discussed recommendations for return to MAU if BP is elevated and/or patient develops headache, epigastric pain, blurred vision, or otherwise is feeling generalized discomfort.   2. [redacted] weeks gestation of pregnancy - Patient would prefer eIOL at 39 weeks due to symptoms she has been feeling the last two weeks and presenting to MAU for.Patient also reports having increased situational stress induced by current family challenges and difficulty with maintaining appointments and childcare for first born. Offered IBH to patient, patient will inform provider if she would like to utilize counseling support offered.  Term labor symptoms and general obstetric precautions including but not limited to vaginal bleeding, contractions, leaking of fluid and fetal movement were reviewed in detail with the patient. I discussed the assessment and treatment plan with the patient. The patient was provided an opportunity to ask questions and all were answered. The patient agreed with the plan and demonstrated an understanding of the instructions. The patient was advised to call back or seek an in-person office evaluation/[facility] [place] [facility] [place] [place] any urgent or concerning symptoms. Please refer to After Visit Summary for other counseling recommendations.   I provided 20 minutes of face-to-face time during this encounter.  No follow-ups on file.  Future Appointments  Date Time Provider DeLevel Green8/18/2023  7:45 AM MC-LD SCHED ROOM MC-INDC None  04/16/2022  1:35 PM [facility]8/28/2023  1:15 PM PiAletha HalimMD [facility]  04/23/2022  2:15 PM WMC-WOCA NST WMC-CWH Graf, NP Center for [employer], [place]

## 2022-04-09 NOTE — Telephone Encounter (Signed)
Called patient to inform of appointment move, there was no answer to the phone call and the option to leave a voicemail was unavailable.

## 2022-04-10 ENCOUNTER — Telehealth (HOSPITAL_COMMUNITY): Payer: Self-pay | Admitting: *Deleted

## 2022-04-10 ENCOUNTER — Encounter (HOSPITAL_COMMUNITY): Payer: Self-pay | Admitting: *Deleted

## 2022-04-10 ENCOUNTER — Encounter: Payer: Medicaid Other | Admitting: Student

## 2022-04-10 NOTE — Telephone Encounter (Signed)
Preadmission screen  

## 2022-04-11 ENCOUNTER — Other Ambulatory Visit: Payer: Self-pay | Admitting: Advanced Practice Midwife

## 2022-04-11 ENCOUNTER — Encounter: Payer: Self-pay | Admitting: General Practice

## 2022-04-11 DIAGNOSIS — Z3490 Encounter for supervision of normal pregnancy, unspecified, unspecified trimester: Secondary | ICD-10-CM

## 2022-04-11 MED ORDER — TERCONAZOLE 0.4 % VA CREA: 1.0000 | TOPICAL_CREAM | Freq: Every day | VAGINAL | 0 refills | Status: DC

## 2022-04-11 NOTE — Addendum Note (Signed)
Addended by: Shelly Coss on: 04/11/2022 11:49 AM   Modules accepted: Orders

## 2022-04-13 ENCOUNTER — Inpatient Hospital Stay (HOSPITAL_COMMUNITY)
Admission: AD | Admit: 2022-04-13 | Discharge: 2022-04-15 | DRG: 806 | Disposition: A | Discharge status: Home / Self Care | Payer: Medicaid Other | Attending: Obstetrics and Gynecology | Admitting: Obstetrics and Gynecology

## 2022-04-13 ENCOUNTER — Inpatient Hospital Stay (HOSPITAL_COMMUNITY): Payer: Medicaid Other | Admitting: Anesthesiology

## 2022-04-13 ENCOUNTER — Other Ambulatory Visit: Payer: Self-pay

## 2022-04-13 ENCOUNTER — Encounter (HOSPITAL_COMMUNITY): Payer: Self-pay | Admitting: Obstetrics and Gynecology

## 2022-04-13 ENCOUNTER — Inpatient Hospital Stay (HOSPITAL_COMMUNITY): Payer: Medicaid Other

## 2022-04-13 DIAGNOSIS — O9081 Anemia of the puerperium: Secondary | ICD-10-CM | POA: Diagnosis not present

## 2022-04-13 DIAGNOSIS — Z3A39 39 weeks gestation of pregnancy: Secondary | ICD-10-CM

## 2022-04-13 DIAGNOSIS — O26893 Other specified pregnancy related conditions, third trimester: Secondary | ICD-10-CM | POA: Diagnosis present

## 2022-04-13 DIAGNOSIS — Z3490 Encounter for supervision of normal pregnancy, unspecified, unspecified trimester: Principal | ICD-10-CM | POA: Diagnosis present

## 2022-04-13 DIAGNOSIS — D62 Acute posthemorrhagic anemia: Secondary | ICD-10-CM | POA: Diagnosis not present

## 2022-04-13 LAB — TYPE AND SCREEN
ABO/RH(D): A POS
Antibody Screen: NEGATIVE

## 2022-04-13 LAB — CBC
HCT: 31.5 % — ABNORMAL LOW (ref 36.0–46.0)
Hemoglobin: 10.7 g/dL — ABNORMAL LOW (ref 12.0–15.0)
MCH: 28.2 pg (ref 26.0–34.0)
MCHC: 34 g/dL (ref 30.0–36.0)
MCV: 82.9 fL (ref 80.0–100.0)
Platelets: 305 10*3/uL (ref 150–400)
RBC: 3.8 MIL/uL — ABNORMAL LOW (ref 3.87–5.11)
RDW: 19.9 % — ABNORMAL HIGH (ref 11.5–15.5)
WBC: 8.3 10*3/uL (ref 4.0–10.5)
nRBC: 0 % (ref 0.0–0.2)

## 2022-04-13 LAB — RPR: RPR Ser Ql: NONREACTIVE

## 2022-04-13 MED ORDER — SOD CITRATE-CITRIC ACID 500-334 MG/5ML PO SOLN: 30.0000 mL | ORAL | Status: DC | PRN

## 2022-04-13 MED ORDER — FENTANYL-BUPIVACAINE-NACL 0.5-0.125-0.9 MG/250ML-% EP SOLN
12.0000 mL/h | EPIDURAL | Status: DC | PRN
  Administered 2022-04-13: 12 mL/h via EPIDURAL
  Filled 2022-04-13: qty 250

## 2022-04-13 MED ORDER — OXYCODONE-ACETAMINOPHEN 5-325 MG PO TABS: 1.0000 | ORAL_TABLET | ORAL | Status: DC | PRN

## 2022-04-13 MED ORDER — MISOPROSTOL 50MCG HALF TABLET: 50.0000 ug | ORAL_TABLET | Freq: Once | ORAL | Status: DC

## 2022-04-13 MED ORDER — PHENYLEPHRINE 80 MCG/ML (10ML) SYRINGE FOR IV PUSH (FOR BLOOD PRESSURE SUPPORT): 80.0000 ug | PREFILLED_SYRINGE | INTRAVENOUS | Status: DC | PRN

## 2022-04-13 MED ORDER — IBUPROFEN 600 MG PO TABS
600.0000 mg | ORAL_TABLET | Freq: Four times a day (QID) | ORAL | Status: DC
  Administered 2022-04-13 – 2022-04-15 (×4): 600 mg via ORAL
  Filled 2022-04-13 (×4): qty 1

## 2022-04-13 MED ORDER — LACTATED RINGERS IV SOLN: INTRAVENOUS | Status: DC

## 2022-04-13 MED ORDER — OXYTOCIN BOLUS FROM INFUSION
333.0000 mL | Freq: Once | INTRAVENOUS | Status: AC
  Administered 2022-04-13: 333 mL via INTRAVENOUS

## 2022-04-13 MED ORDER — TRANEXAMIC ACID-NACL 1000-0.7 MG/100ML-% IV SOLN: 1000.0000 mg | Freq: Once | INTRAVENOUS | Status: AC

## 2022-04-13 MED ORDER — LACTATED RINGERS IV SOLN
500.0000 mL | INTRAVENOUS | Status: DC | PRN
  Administered 2022-04-13: 500 mL via INTRAVENOUS

## 2022-04-13 MED ORDER — COCONUT OIL OIL
1.0000 | TOPICAL_OIL | Status: DC | PRN
Start: 2022-04-13 — End: 2022-04-15

## 2022-04-13 MED ORDER — LIDOCAINE-EPINEPHRINE (PF) 1.5 %-1:200000 IJ SOLN
INTRAMUSCULAR | Status: DC | PRN
  Administered 2022-04-13: 5 mL via PERINEURAL

## 2022-04-13 MED ORDER — LIDOCAINE HCL (PF) 1 % IJ SOLN: 30.0000 mL | INTRAMUSCULAR | Status: DC | PRN

## 2022-04-13 MED ORDER — METHYLERGONOVINE MALEATE 0.2 MG/ML IJ SOLN
0.2000 mg | Freq: Once | INTRAMUSCULAR | Status: AC
Start: 2022-04-13 — End: 2022-04-13

## 2022-04-13 MED ORDER — ONDANSETRON HCL 4 MG/2ML IJ SOLN: 4.0000 mg | Freq: Four times a day (QID) | INTRAMUSCULAR | Status: DC | PRN

## 2022-04-13 MED ORDER — LACTATED RINGERS IV SOLN
500.0000 mL | Freq: Once | INTRAVENOUS | Status: AC
  Administered 2022-04-13: 500 mL via INTRAVENOUS

## 2022-04-13 MED ORDER — BENZOCAINE-MENTHOL 20-0.5 % EX AERO
1.0000 | INHALATION_SPRAY | CUTANEOUS | Status: DC | PRN
  Administered 2022-04-14: 1 via TOPICAL
  Filled 2022-04-13: qty 56

## 2022-04-13 MED ORDER — METHYLERGONOVINE MALEATE 0.2 MG/ML IJ SOLN
INTRAMUSCULAR | Status: AC
  Administered 2022-04-13: 0.2 mg via INTRAMUSCULAR
  Filled 2022-04-13: qty 1

## 2022-04-13 MED ORDER — DIPHENHYDRAMINE HCL 50 MG/ML IJ SOLN: 12.5000 mg | INTRAMUSCULAR | Status: DC | PRN

## 2022-04-13 MED ORDER — OXYTOCIN-SODIUM CHLORIDE 30-0.9 UT/500ML-% IV SOLN
1.0000 m[IU]/min | INTRAVENOUS | Status: DC
  Administered 2022-04-13: 2 m[IU]/min via INTRAVENOUS
  Filled 2022-04-13 (×2): qty 500

## 2022-04-13 MED ORDER — TETANUS-DIPHTH-ACELL PERTUSSIS 5-2.5-18.5 LF-MCG/0.5 IM SUSY: 0.5000 mL | PREFILLED_SYRINGE | Freq: Once | INTRAMUSCULAR | Status: DC

## 2022-04-13 MED ORDER — ONDANSETRON HCL 4 MG/2ML IJ SOLN: 4.0000 mg | INTRAMUSCULAR | Status: DC | PRN

## 2022-04-13 MED ORDER — OXYTOCIN-SODIUM CHLORIDE 30-0.9 UT/500ML-% IV SOLN: 2.5000 [IU]/h | INTRAVENOUS | Status: DC

## 2022-04-13 MED ORDER — ONDANSETRON HCL 4 MG PO TABS: 4.0000 mg | ORAL_TABLET | ORAL | Status: DC | PRN

## 2022-04-13 MED ORDER — PRENATAL MULTIVITAMIN CH: 1.0000 | ORAL_TABLET | Freq: Every day | ORAL | Status: DC

## 2022-04-13 MED ORDER — ACETAMINOPHEN 325 MG PO TABS: 650.0000 mg | ORAL_TABLET | ORAL | Status: DC | PRN

## 2022-04-13 MED ORDER — FENTANYL CITRATE (PF) 100 MCG/2ML IJ SOLN
100.0000 ug | INTRAMUSCULAR | Status: DC | PRN
  Administered 2022-04-13 (×2): 100 ug via INTRAVENOUS
  Filled 2022-04-13 (×2): qty 2

## 2022-04-13 MED ORDER — DIPHENHYDRAMINE HCL 25 MG PO CAPS: 25.0000 mg | ORAL_CAPSULE | Freq: Four times a day (QID) | ORAL | Status: DC | PRN

## 2022-04-13 MED ORDER — EPHEDRINE 5 MG/ML INJ: 10.0000 mg | INTRAVENOUS | Status: DC | PRN

## 2022-04-13 MED ORDER — SENNOSIDES-DOCUSATE SODIUM 8.6-50 MG PO TABS: 2.0000 | ORAL_TABLET | ORAL | Status: DC

## 2022-04-13 MED ORDER — TERBUTALINE SULFATE 1 MG/ML IJ SOLN: 0.2500 mg | Freq: Once | INTRAMUSCULAR | Status: DC | PRN

## 2022-04-13 MED ORDER — OXYCODONE-ACETAMINOPHEN 5-325 MG PO TABS: 2.0000 | ORAL_TABLET | ORAL | Status: DC | PRN

## 2022-04-13 MED ORDER — TRANEXAMIC ACID-NACL 1000-0.7 MG/100ML-% IV SOLN
INTRAVENOUS | Status: AC
  Administered 2022-04-13: 1000 mg via INTRAVENOUS
  Filled 2022-04-13: qty 100

## 2022-04-13 MED ORDER — LIDOCAINE HCL (PF) 1 % IJ SOLN
INTRAMUSCULAR | Status: DC | PRN
  Administered 2022-04-13: 3 mL via EPIDURAL

## 2022-04-13 MED ORDER — METHYLERGONOVINE MALEATE 0.2 MG PO TABS
0.2000 mg | ORAL_TABLET | Freq: Once | ORAL | Status: AC
  Administered 2022-04-14: 0.2 mg via ORAL
  Filled 2022-04-13: qty 1

## 2022-04-13 MED ORDER — SIMETHICONE 80 MG PO CHEW: 80.0000 mg | CHEWABLE_TABLET | ORAL | Status: DC | PRN

## 2022-04-13 MED ORDER — SODIUM CHLORIDE 0.9 % IV SOLN
500.0000 mg | INTRAVENOUS | Status: DC
  Filled 2022-04-13: qty 25

## 2022-04-13 MED ORDER — WITCH HAZEL-GLYCERIN EX PADS: 1.0000 | MEDICATED_PAD | CUTANEOUS | Status: DC | PRN

## 2022-04-13 MED ORDER — DIBUCAINE (PERIANAL) 1 % EX OINT: 1.0000 | TOPICAL_OINTMENT | CUTANEOUS | Status: DC | PRN

## 2022-04-13 NOTE — Lactation Note (Signed)
This note was copied from a baby's chart. Lactation Consultation Note  Patient Name: Beverly Mckenzie HCOBT'V Date: 04/13/2022   Age:21 hours Birth Parent declined [place] services in L&D.  Maternal Data    Feeding    LATCH Score                    Lactation Tools Discussed/Used    Interventions    Discharge    Consult Status      Vicente Serene 04/13/2022, 8:22 PM

## 2022-04-13 NOTE — Anesthesia Preprocedure Evaluation (Signed)
Anesthesia Evaluation  Patient identified by MRN, date of birth, ID band Patient awake    Reviewed: Allergy & Precautions, H&P , NPO status , Patient's Chart, lab work & pertinent test results  History of Anesthesia Complications Negative for: history of anesthetic complications  Airway Mallampati: II  TM Distance: >3 FB     Dental   Pulmonary asthma ,    Pulmonary exam normal        Cardiovascular negative cardio ROS   Rhythm:regular Rate:Normal     Neuro/Psych Depression Bipolar Disorder negative neurological ROS  negative psych ROS   GI/Hepatic negative GI ROS, Neg liver ROS,   Endo/Other  negative endocrine ROS  Renal/GU negative Renal ROS  negative genitourinary   Musculoskeletal   Abdominal   Peds  Hematology negative hematology ROS (+)   Anesthesia Other Findings   Reproductive/Obstetrics (+) Pregnancy                             Anesthesia Physical Anesthesia Plan  ASA: 2  Anesthesia Plan: Epidural   Post-op Pain Management:    Induction:   PONV Risk Score and Plan:   Airway Management Planned:   Additional Equipment:   Intra-op Plan:   Post-operative Plan:   Informed Consent: I have reviewed the patients History and Physical, chart, labs and discussed the procedure including the risks, benefits and alternatives for the proposed anesthesia with the patient or authorized representative who has indicated his/her understanding and acceptance.       Plan Discussed with:   Anesthesia Plan Comments:         Anesthesia Quick Evaluation

## 2022-04-13 NOTE — Anesthesia Procedure Notes (Signed)
Epidural Patient location during procedure: OB Start time: 04/13/2022 4:52 PM End time: 04/13/2022 5:03 PM  Staffing Anesthesiologist: Lidia Collum, MD Performed: anesthesiologist   Preanesthetic Checklist Completed: patient identified, IV checked, risks and benefits discussed, monitors and equipment checked, pre-op evaluation and timeout performed  Epidural Patient position: sitting Prep: DuraPrep Patient monitoring: heart rate, continuous pulse ox and blood pressure Approach: midline Location: L3-L4 Injection technique: LOR air  Needle:  Needle type: Tuohy  Needle gauge: 17 G Needle length: 9 cm Needle insertion depth: 6 cm Catheter type: closed end flexible Catheter size: 19 Gauge Catheter at skin depth: 11 cm Test dose: negative  Assessment Events: blood not aspirated, injection not painful, no injection resistance, no paresthesia and negative IV test  Additional Notes Reason for block:procedure for pain

## 2022-04-13 NOTE — Progress Notes (Signed)
   Beverly Mckenzie is a 21 y.o. G2P1001 at [redacted]w[redacted]d admitted for eIOL.   Subjective:  Patient doing well, would like AROM. Feeling some discomfort Objective: Vitals:   04/13/22 1011 04/13/22 1103 04/13/22 1205 04/13/22 1336  BP: 124/73 110/60 117/74 106/68  Pulse: 77 83 76 87  Resp:      Temp:  97.8 F (36.6 C)    TempSrc:  Oral    Weight:      Height:       No intake/output data recorded.  FHT:  FHR: 140 bpm, variability: moderate,  accelerations:  Present,  decelerations:  Absent UC:   regular, every 2-3 minutes SVE:   Dilation: 4 Effacement (%): 60 Station: -2 Exam by:: KMaye Hides CNM Pitocin @ 14 mu/min  Labs: Lab Results  Component Value Date   WBC 8.3 04/13/2022   HGB 10.7 (L) 04/13/2022   HCT 31.5 (L) 04/13/2022   MCV 82.9 04/13/2022   PLT 305 04/13/2022    Assessment / Plan: Patient AROM with clear fluid, continue to titrate pitocin.   Labor:  early labor Fetal Wellbeing:  Category I Pain Control:  Labor support without medications Anticipated MOD:  NSVD  KStarr Lake8/18/2023, 2:49 PM

## 2022-04-13 NOTE — Lactation Note (Signed)
This note was copied from a baby's chart. Lactation Consultation Note  Patient Name: Beverly Mckenzie TKTCC'E Date: 04/13/2022   Age:21 hours Per RN Rowan Blase),  Birth Parent declined [facility] services on [place].  Maternal Data    Feeding    LATCH Score                    Lactation Tools Discussed/Used    Interventions    Discharge    Consult Status      Vicente Serene 04/13/2022, 11:52 PM

## 2022-04-13 NOTE — Progress Notes (Signed)
Went to patient's room to evaluate bleeding.  Postdelivery she had a large clot that was expelled and her vagina was packed with a lap.  On evaluation the lap was saturated with blood and there was a small trickle.  Lap was removed.  Bladder was emptied using in and out Foley and 400 mL of urine was collected.  Cervix was evaluated using a speculum and showed no signs of laceration.  Bleeding clearly coming from cervical os was minimal.  We will continue to monitor.  Did schedule additional dose of Methergine 6 hours after her first dose.

## 2022-04-13 NOTE — H&P (Cosign Needed Addendum)
OBSTETRIC ADMISSION HISTORY AND PHYSICAL  Beverly Mckenzie is a 21 y.o. female G2P1001 with IUP at [redacted]w[redacted]d presenting for scheduled IOL. She reports +FMs. No LOF, VB, blurry vision, headaches, peripheral edema, or RUQ pain. She plans on breast feeding. She requests outpatient IUD for birth control.   Dating: By ultrasound --->  Estimated Date of Delivery: 04/20/22  Sono:    @[redacted]w[redacted]d, normal anatomy, cephalic presentation, 2103g, EFW 50%   Prenatal History/Complications: States she has had anemia, was at the doctors office and blood drawn. Iron low, did not want to do IV iron because of constipation in prior pregnancy. She has been taking po iron. Originally presented with drowsiness.  Insufficient prental care 11-29 weeks, as a result did not have AFP lab performed.  History of Bipolar 1, does not taking medication. Prior suicide attempt. Reports mood is good.  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  History of Asthma, last inhaler use two years ago.  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    Past Surgical History: Past Surgical History:  Procedure Laterality Date   NO PAST SURGERIES      Obstetrical History: OB History     Gravida  2   Para  1   Term  1   Preterm      AB      Living  1      SAB      IAB      Ectopic      Multiple  0   Live Births  1           Social History: Social History   Socioeconomic History   Marital status: Single    Spouse name: Not on file   Number of children: Not on file   Years of education: Not on file   Highest education level: Not on file  Occupational History   Not on file  Tobacco Use   Smoking status: Never   Smokeless tobacco: Never  Vaping Use   Vaping Use: Never used  Substance and Sexual Activity   Alcohol use: No   Drug use: No   Sexual  activity: Not Currently    Birth control/protection: None  Other Topics Concern   Not on file  Social History Narrative   Not on file   Social Determinants of Health   Financial Resource Strain: Not on file  Food Insecurity: No Food Insecurity (03/22/2022)   Hunger Vital Sign    Worried About Running Out of Food in the Last Year: Never true    Ran Out of Food in the Last Year: Never true  Transportation Needs: No Transportation Needs (03/22/2022)   PRAPARE - Transportation    Lack of Transportation (Medical): No    Lack of Transportation (Non-Medical): No  Physical Activity: Not on file  Stress: Not on file  Social Connections: Not on file    Family History: Family History  Problem Relation Age of Onset   Healthy Mother    Healthy Father     Allergies: Allergies  Allergen Reactions   Justicia Adhatoda (Malabar Nut Tree) [Justicia Adhatoda] Anaphylaxis and Hives    PT STATE SHE IS ALLERGIC TO ALL TREE NUTS    Cat Hair Extract Hives and Swelling   Dog Epithelium Allergy Skin Test Hives   and Swelling    Medications Prior to Admission  Medication Sig Dispense Refill Last Dose   ferrous sulfate (FERROUSUL) 325 (65 FE) MG tablet Take 1 tablet (325 mg total) by mouth every other day. 90 tablet 1 04/12/2022   Prenatal Vit-Fe Fumarate-FA (MULTIVITAMIN-PRENATAL) 27-0.8 MG TABS tablet Take 1 tablet by mouth daily at 12 noon.   04/12/2022   terconazole (TERAZOL 7) 0.4 % vaginal cream Place 1 applicator vaginally at bedtime for 7 days. 45 g 0 Unknown     Review of Systems:  All systems reviewed and negative except as stated in HPI  PE: Blood pressure 124/73, pulse 77, temperature (!) 97.3 F (36.3 C), temperature source Oral, resp. rate 18, height 5' 8" (1.727 m), weight 88.8 kg, last menstrual period 07/14/2021, not currently breastfeeding. General appearance: alert and no distress Lungs: regular rate and effort Heart: regular rate  Abdomen: soft, non-tender Extremities:  Homans sign is negative, no sign of DVT Presentation: cephalic per above [place] <[id number]>_3<\/UKGURKYHCWCBJSEG>_3  EFM: 135 bpm contractions 2-3 minutes apart, moderate variability, present accels, no decels Dilation: 3 Effacement (%): 50 Station: -3 Exam by:: Nilsa Nutting, Rn SVE: not indicated  Prenatal labs: ABO, Rh: --/--/A POS (08/18 0700) Antibody: NEG (08/18 0700) Rubella: 1.60 (02/08 1408) RPR: Non Reactive (06/26 0909)  HBsAg: Negative (02/08 1408)  HIV: Non Reactive (06/26 0909)  GBS: Negative/-- (07/31 1351)  2 hr GTT negative  Prenatal Transfer Tool  Maternal Diabetes: No Genetic Screening: Abnormal:  Results: Other: Carrier of SMA also a Variant of Hurler Syndrome, Panorama negative Maternal Ultrasounds/Referrals: Normal Fetal Ultrasounds or other Referrals:  Referred to Materal Fetal Medicine , seen for growth scans during her pregnancy Maternal Substance Abuse:  No Significant Maternal Medications:  None prenatal and iron, does not take anything for bipolar Significant Maternal Lab Results: Group B Strep negative  Results for orders placed or performed during the hospital encounter of 04/13/22 (from the past 24 hour(s))  CBC   Collection Time: 04/13/22  7:00 AM  Result Value Ref Range   WBC 8.3 4.0 - 10.5 K/uL   RBC 3.80 (L) 3.87 - 5.11 MIL/uL   Hemoglobin 10.7 (L) 12.0 - 15.0 g/dL   HCT 31.5 (L) 36.0 - 46.0 %   MCV 82.9 80.0 - 100.0 fL   MCH 28.2 26.0 - 34.0 pg   MCHC 34.0 30.0 - 36.0 g/dL   RDW 19.9 (H) 11.5 - 15.5 %   Platelets 305 150 - 400 K/uL   nRBC 0.0 0.0 - 0.2 %  Type and screen   Collection Time: 04/13/22  7:00 AM  Result Value Ref Range   ABO/RH(D) A POS    Antibody Screen NEG    Sample Expiration      04/16/2022,2359 Performed at [facility], [address] [place], [place] [address]     Patient Active Problem List   Diagnosis Date Noted   Encounter for elective induction of labor 04/13/2022   Anemia affecting pregnancy in third trimester 02/24/2022    Insufficient prenatal care 02/19/2022   Suicide attempt by drug overdose ([place]) 10/09/2021   Bipolar 1 disorder ([place]) 10/09/2021   Supervision of other normal pregnancy, antepartum 09/19/2021   Genetic carrier 04/16/2020   Asthma     Assessment: Beverly Mckenzie is a 21 y.o. G2P1001 at 61w0dhere for scheduled IOL  1. Labor: Oxytocin started 2. FWB: Category 1 3. Pain: Percocet 5-325 q4h PRN 4. GBS: negative   Plan: Admit to Labor and Delivery  Salvadore Oxford, MD  04/13/2022, 10:26 AM  Attestation of Supervision of Student:  I confirm that I have verified the information documented in the  resident  student's note and that I have also personally reperformed  the history, physical exam and all medical decision making activities.  I have verified that all services and findings are accurately documented in this student's note; and I agree with management and plan as outlined in the documentation. I have also made any necessary editorial changes.  -patient would like circ for baby  Starr Lake, [place] for [employer], [employer] [facility] 04/13/2022 2:10 PM

## 2022-04-14 ENCOUNTER — Other Ambulatory Visit: Payer: Self-pay | Admitting: Student

## 2022-04-14 DIAGNOSIS — F319 Bipolar disorder, unspecified: Secondary | ICD-10-CM

## 2022-04-14 LAB — CBC
HCT: 30.6 % — ABNORMAL LOW (ref 36.0–46.0)
Hemoglobin: 9.7 g/dL — ABNORMAL LOW (ref 12.0–15.0)
MCH: 26.4 pg (ref 26.0–34.0)
MCHC: 31.7 g/dL (ref 30.0–36.0)
MCV: 83.4 fL (ref 80.0–100.0)
Platelets: 266 10*3/uL (ref 150–400)
RBC: 3.67 MIL/uL — ABNORMAL LOW (ref 3.87–5.11)
RDW: 20 % — ABNORMAL HIGH (ref 11.5–15.5)
WBC: 11.8 10*3/uL — ABNORMAL HIGH (ref 4.0–10.5)
nRBC: 0 % (ref 0.0–0.2)

## 2022-04-14 MED ORDER — FERROUS GLUCONATE 324 (38 FE) MG PO TABS
324.0000 mg | ORAL_TABLET | Freq: Every day | ORAL | Status: DC
  Administered 2022-04-14 – 2022-04-15 (×2): 324 mg via ORAL
  Filled 2022-04-14 (×2): qty 1

## 2022-04-14 MED ORDER — SODIUM CHLORIDE 0.9 % IV SOLN
500.0000 mg | INTRAVENOUS | Status: DC
  Filled 2022-04-14: qty 25

## 2022-04-14 NOTE — Discharge Summary (Signed)
Postpartum Discharge Summary  Date of Service updated***     Patient Name: Beverly Mckenzie DOB: 01/10/2001 MRN: [id number]  Date of admission: 04/13/2022 Delivery date:04/13/2022  Delivering provider: Starr Lake  Date of discharge: 04/14/2022  Admitting diagnosis: Encounter for elective induction of labor [Z34.90] Intrauterine pregnancy: [redacted]w[redacted]d     Secondary diagnosis:  Principal Problem:   Encounter for elective induction of labor Active Problems:   PPH (postpartum hemorrhage)  Additional problems: ***    Discharge diagnosis: Term Pregnancy Delivered                                              Post partum procedures: IV venofer Augmentation: AROM and Pitocin Complications: [id number]  Hospital course: Induction of Labor With Vaginal Delivery   21 y.o. yo [id number] at [redacted]w[redacted]d was admitted to the hospital 04/13/2022 for induction of labor.  Indication for induction: Favorable cervix at term.  Patient had an uncomplicated labor course as follows: Membrane Rupture Time/Date: 2:32 PM ,04/13/2022   Delivery Method:Vaginal, Spontaneous  Episiotomy: None  Lacerations:  2nd degree  Details of delivery can be found in separate delivery note.  Patient had a routine postpartum course. Patient is discharged home 04/14/22.  Newborn Data: Birth date:04/13/2022  Birth time:7:05 PM  Gender:Female  Living status:Living  Apgars:8 ,9  Weight:3487 g   Magnesium Sulfate received: No BMZ received: No Rhophylac:No MMR:{MMR:30440033} T-DaP:{Tdap:23962} Flu: {UKG:25427} Transfusion:{Transfusion received:30440034}  Physical exam  Vitals:   04/13/22 2140 04/13/22 2250 04/14/22 0300 04/14/22 0741  BP: 121/80 127/81 100/71 113/82  Pulse: 70 76 80 76  Resp: $Remo'18 18 18 18  'MqfbH$ Temp: 97.9 F (36.6 C) 98.6 F (37 C) 98.4 F (36.9 C) 98.1 F (36.7 C)  TempSrc: Oral Oral Oral   SpO2: 100% 99% 99% 100%  Weight:      Height:       General: {Exam; general:21111117} Lochia:  {Desc; appropriate/inappropriate:30686::"appropriate"} Uterine Fundus: {Desc; firm/soft:30687} Incision: {Exam; incision:21111123} DVT Evaluation: {Exam; CWC:3762831} Labs: Lab Results  Component Value Date   WBC 11.8 (H) 04/14/2022   HGB 9.7 (L) 04/14/2022   HCT 30.6 (L) 04/14/2022   MCV 83.4 04/14/2022   PLT 266 04/14/2022      Latest Ref Rng & Units 05/05/2021    5:14 PM  CMP  Glucose 70 - 99 mg/dL 83   BUN 6 - 20 mg/dL 7   Creatinine 0.44 - 1.00 mg/dL 0.78   Sodium 135 - 145 mmol/L 136   Potassium 3.5 - 5.1 mmol/L 4.0   Chloride 98 - 111 mmol/L 104   CO2 22 - 32 mmol/L 23   Calcium 8.9 - 10.3 mg/dL 9.6   Total Protein 6.5 - 8.1 g/dL 7.5   Total Bilirubin 0.3 - 1.2 mg/dL 0.6   Alkaline Phos 38 - 126 U/L 61   AST 15 - 41 U/L 17   ALT 0 - 44 U/L 12    Edinburgh Score:    04/14/2022    3:51 AM  Edinburgh Postnatal Depression Scale Screening Tool  I have been able to laugh and see the funny side of things. 0  I have looked forward with enjoyment to things. 0  I have blamed myself unnecessarily when things went wrong. 1  I have been anxious or worried for no good reason. 0  I have felt scared or panicky for no  good reason. 1  Things have been getting on top of me. 0  I have been so unhappy that I have had difficulty sleeping. 0  I have felt sad or miserable. 0  I have been so unhappy that I have been crying. 0  The thought of harming myself has occurred to me. 0  Edinburgh Postnatal Depression Scale Total 2     After visit meds:  Allergies as of 04/14/2022       Reactions   Justicia Adhatoda (malabar Nut Tree) [justicia Adhatoda] Anaphylaxis, Hives   PT STATE SHE IS ALLERGIC TO ALL TREE NUTS    Cat Hair Extract Hives, Swelling   Dog Epithelium Allergy Skin Test Hives, Swelling     Med Rec must be completed prior to using this [place]***        Discharge home in stable condition Infant Feeding: {Baby feeding:23562} Infant Disposition:{CHL IP OB HOME  WITH DJTTSV:77939} Discharge instruction: per After Visit Summary and Postpartum booklet. Activity: Advance as tolerated. Pelvic rest for 6 weeks.  Diet: {OB QZES:92330076} Future Appointments: Future Appointments  Date Time Provider [place]  04/16/2022  1:35 PM Kieth Brightly [facility] [place]  04/23/2022  1:15 PM Aletha Halim, MD [facility]  04/23/2022  2:15 PM [facility]   Follow up Visit:   Please schedule this patient for a In person postpartum visit in  4-6  with the following provider:  KK  . Additional Postpartum F/U: PP mood check High risk pregnancy complicated by: Limited prenatal care, PPH Delivery mode:  Vaginal, Spontaneous  Anticipated Birth Control:  Unsure   04/14/2022 Starr Lake, CNM

## 2022-04-14 NOTE — Anesthesia Postprocedure Evaluation (Signed)
Anesthesia Post Note  Patient: Social worker  Procedure(s) Performed: AN AD [place]     Patient location during evaluation: Mother Baby Anesthesia Type: Epidural Level of consciousness: awake and alert Pain management: pain level controlled Vital Signs Assessment: post-procedure vital signs reviewed and stable Respiratory status: spontaneous breathing, nonlabored ventilation and respiratory function stable Cardiovascular status: stable Postop Assessment: no headache, no backache and epidural receding Anesthetic complications: no   No notable events documented.  Last Vitals:  Vitals:   04/13/22 2250 04/14/22 0300  BP: 127/81 100/71  Pulse: 76 80  Resp: 18 18  Temp: 37 C 36.9 C  SpO2: 99% 99%    Last Pain:  Vitals:   04/14/22 0351  TempSrc:   PainSc: 0-No pain   Pain Goal:                   Stefani Dama

## 2022-04-14 NOTE — Clinical Social Work Maternal (Signed)
CLINICAL SOCIAL WORK MATERNAL/CHILD NOTE  Patient Details  Name: Beverly Mckenzie MRN: [id number] Date of Birth: 2001-01-19  Date:  04/14/2022  Clinical Social Worker Initiating Note:  Idamae Lusher, MSW, [place], [place] Date/Time: Initiated:  04/14/22/1345     Child's Name:  Beverly Mckenzie   Biological Parents:  Mother, Father (FOB: Jaynee Eagles DOB: 07/16/2001)   Need for Interpreter:  None   Reason for Referral:  Behavioral Health Concerns   Address:  [address] [place] [address]    Phone number:  [phone] (home)     Additional phone number:   Household Members/Support Persons (HM/SP):   Household Member/Support Person 1, Household Member/Support Person 2   HM/SP Name Relationship DOB or Age  HM/SP -1 Aggie Hacker MOB's mother    HM/SP -2 Garlon Hatchet 08/23/2020  HM/SP -3        HM/SP -4        HM/SP -5        HM/SP -6        HM/SP -7        HM/SP -8          Natural Supports (not living in the home):  Spouse/significant other   Professional Supports: Therapist   Employment: Part-time   Type of Work: Psychologist, sport and exercise   Education:  Designer, multimedia (In nursing school)   [place] arranged:    Museum/gallery curator Resources:  Medicaid   Other Resources:  [employer], Physicist, medical     Cultural/Religious Considerations Which May Impact Care:  None identified  Strengths:  Ability to meet basic needs  , Home prepared for child  , Pediatrician chosen   Psychotropic Medications:         Pediatrician:    [facility] area  Pediatrician List:   [place]      Pediatrician Fax Number:    Risk Factors/Current Problems:  Mental Health Concerns     Cognitive State:  Able to Concentrate  , Alert  , Goal Oriented  , Linear Thinking     Mood/Affect:  Relaxed  , Comfortable  , Calm  , Interested     CSW Assessment: CSW received  consult to meet with MOB due to history of depression and Bipolar I Disorder. CSW met with MOB at bedside to assess for needs and offer support. When CSW entered room, MOB was laying in hospital bed, bonding with infant skin-to-skin. CSW introduced self and explained reason for consult. MOB was agreeable to consult. MOB was engaged and polite during encounter.   CSW inquired how MOB has felt emotionally since giving birth. MOB shared she feels "good." CSW asked about MOB's mental health history. MOB shares she was diagnosed with Bipolar Disorder I January 2021 after a suicide attempt via intentional drug overdose. MOB recalls she felt depressed at the time. CSW inquired about additional mental health history. CSW inquired if MOB has experienced a manic episode in the past. MOB shares she experienced mania "after 2021 for a few months." MOB denies experiencing manic or depressive symptoms during her pregnancy and since giving birth. MOB shared when she experiences depressive symptoms, she feels "in the slumps, not energetic." MOB reports she experienced SI after she gave birth to her first son in 2022 but denies having a suicide plan or intent. CSW inquired if she believes she experienced postpartum  depression (PPD) after giving birth to her first son. MOB shares she does believe she experienced PPD with her first son, marked by feeling she could not connect with her son. MOB shares she currently feels bonded to her infant and denies current symptoms of PPD. MOB denies a history of anxiety or other mental health symptoms. MOB shares she was prescribed Latuda in 2021 for management of Bipolar I Disorder but shares she did not take the medication due to becoming pregnant with her first son. MOB shares she is current with a therapist, Almyra Free who she sees every 2 weeks and reports is helpful. 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 MOB denies current S/HI/DV.   MOB reports she has all  needed items for infant, including a car seat and crib. MOB reports she receives food stamps and WIC. CSW encouraged MOB to notify her caseworkers of infant's birth to have him added to her benefits. MOB had identified [facility] as infant's pediatrician.   CSW provided education regarding the baby blues period vs. perinatal mood disorders, discussed treatment and offered resources for mental health follow up if concerns arise.   MOB declined additional mental health resources, stating that she feels supported at this time. CSW recommends self-evaluation during the postpartum time period using the New Mom Checklist from Postpartum Progress and encouraged MOB to contact a medical professional if symptoms are noted at any time.    CSW provided review of Sudden Infant Death Syndrome (SIDS) precautions.    CSW identifies no further need for intervention and no barriers to discharge at this time.   CSW Plan/Description:  No Further Intervention Required/No Barriers to Discharge, Perinatal Mood and Anxiety Disorder (PMADs) Education, Sudden Infant Death Syndrome (SIDS) Education    Kenna Gilbert [place], [place] 04/14/2022, 1:49 PM

## 2022-04-14 NOTE — Progress Notes (Signed)
Post Partum Day 1  Subjective: Doing well. No acute events overnight. Pain is controlled and bleeding is appropriate. She is eating, drinking, voiding, and ambulating without issue. She is breastfeeding which is going well. She has no other concerns at this time.  Objective: Blood pressure 100/71, pulse 80, temperature 98.4 F (36.9 C), temperature source Oral, resp. rate 18, height '5\' 8"'$  (1.727 m), weight 88.8 kg, last menstrual period 07/14/2021, SpO2 99 %, unknown if currently breastfeeding.  Physical Exam:  General: alert, cooperative, and no distress Lochia: appropriate Uterine Fundus: firm, below umbilicus DVT Evaluation: No evidence of DVT seen on physical exam.     Latest Ref Rng & Units 04/14/2022    4:17 AM 04/13/2022    7:00 AM 02/19/2022    9:09 AM  CBC  WBC 4.0 - 10.5 K/uL 11.8  8.3  9.1    10.0   Hemoglobin 12.0 - 15.0 g/dL 9.7  10.7  9.5    9.3   Hematocrit 36.0 - 46.0 % 30.6  31.5  29.3    29.5   Platelets 150 - 400 K/uL 266  305  285    334      Assessment/Plan: Beverly Mckenzie is a 21 y.o. G2P2 on PPD# 1 s/p NSVD  -PPH, EBL 1000cc due to atony, given cytotec and methergine, CBC stable this am.  VSS, pt asymptomatic  Progressing well. Meeting postpartum milestones.   Continue routine postpartum care.  Feeding: breastfeeding Contraception: desires Paragard as outpatient  Dispo: Continue routine postpartum care   LOS: 1 day   Janyth Pupa, DO Attending [place], [place] for [employer], [place]

## 2022-04-15 MED ORDER — IBUPROFEN 600 MG PO TABS: 600.0000 mg | ORAL_TABLET | Freq: Four times a day (QID) | ORAL | 0 refills | Status: AC

## 2022-04-15 MED ORDER — ACETAMINOPHEN 325 MG PO TABS: 650.0000 mg | ORAL_TABLET | ORAL | Status: AC | PRN

## 2022-04-16 ENCOUNTER — Encounter: Payer: Medicaid Other | Admitting: Advanced Practice Midwife

## 2022-04-17 NOTE — BH Specialist Note (Signed)
Pt did not arrive to video visit and did not answer the phone; Unable to leave voice message; left MyChart message for patient.   

## 2022-04-23 ENCOUNTER — Other Ambulatory Visit: Payer: Self-pay

## 2022-04-23 ENCOUNTER — Encounter: Payer: Self-pay | Admitting: Obstetrics and Gynecology

## 2022-05-01 ENCOUNTER — Ambulatory Visit: Payer: Medicaid Other | Admitting: Clinical

## 2022-05-01 DIAGNOSIS — Z91199 Patient's noncompliance with other medical treatment and regimen due to unspecified reason: Secondary | ICD-10-CM

## 2022-05-31 ENCOUNTER — Ambulatory Visit: Payer: Self-pay | Admitting: Student

## 2022-07-02 ENCOUNTER — Ambulatory Visit: Payer: Medicaid Other | Admitting: Nurse Practitioner

## 2022-07-09 ENCOUNTER — Ambulatory Visit: Payer: Medicaid Other | Admitting: Nurse Practitioner

## 2022-10-31 ENCOUNTER — Telehealth: Payer: Self-pay | Admitting: *Deleted

## 2022-10-31 NOTE — Telephone Encounter (Signed)
Pt left VM message stating that she delivered her baby in August. She has been having bad cramps on the Lt side of abdomen and Lt side of back. She also stated that her hair has been falling out. She requests a call back.

## 2022-11-02 NOTE — Telephone Encounter (Signed)
Recent birth in August 2023. D/c breastfeeding in December 2023 and period returned. Reports monthly, regular period that was not painful. States after most recent period she stopped bleeding for 3-4 days and then bleeding resumed. Has been bleeding for past 4 days with severe abdominal cramps on left side with pain that radiates down leg. Has trialed ibuprofen and Tylenol with minimal improvement. Cramps on left side and radiates down into leg. Will see Dione Plover MD 12/11/22. Recommended pt take ibuprofen or Naproxen around the clock for 48 hours to see if this improves bleeding and pain. Will contact office next week with update.

## 2022-12-11 ENCOUNTER — Ambulatory Visit: Payer: Medicaid Other | Admitting: Family Medicine

## 2023-09-12 ENCOUNTER — Ambulatory Visit: Payer: Medicaid Other | Admitting: Family Medicine

## 2023-09-18 ENCOUNTER — Ambulatory Visit: Payer: Medicaid Other | Admitting: Family Medicine

## 2023-09-26 ENCOUNTER — Ambulatory Visit: Payer: Medicaid Other | Admitting: Family Medicine

## 2023-11-28 ENCOUNTER — Telehealth

## 2024-01-16 ENCOUNTER — Ambulatory Visit: Admitting: Family Medicine

## 2024-01-16 ENCOUNTER — Encounter: Payer: Self-pay | Admitting: Family Medicine

## 2024-01-16 VITALS — BP 114/70 | HR 81 | Ht 67.0 in | Wt 167.4 lb

## 2024-01-16 DIAGNOSIS — Z7689 Persons encountering health services in other specified circumstances: Secondary | ICD-10-CM

## 2024-01-16 DIAGNOSIS — Z130 Encounter for screening for diseases of the blood and blood-forming organs and certain disorders involving the immune mechanism: Secondary | ICD-10-CM | POA: Diagnosis not present

## 2024-01-16 DIAGNOSIS — Z30011 Encounter for initial prescription of contraceptive pills: Secondary | ICD-10-CM | POA: Diagnosis not present

## 2024-01-16 DIAGNOSIS — Z13220 Encounter for screening for lipoid disorders: Secondary | ICD-10-CM | POA: Diagnosis not present

## 2024-01-16 DIAGNOSIS — Z0000 Encounter for general adult medical examination without abnormal findings: Secondary | ICD-10-CM

## 2024-01-16 MED ORDER — NORGESTIMATE-ETH ESTRADIOL 0.25-35 MG-MCG PO TABS: 1.0000 | ORAL_TABLET | Freq: Every day | ORAL | 0 refills | Status: DC

## 2024-01-16 NOTE — Progress Notes (Signed)
 New Patient Office Visit  Subjective    Patient ID: Beverly Mckenzie, female    DOB: [birth year]  Age: 23 y.o. MRN: [id number]  CC:  Chief Complaint  Patient presents with   New Patient (Initial Visit)    Blood work- sti/std testing     HPI Beverly Mckenzie presents to establish care and for routine annual exam. Patient denies known chronic med issues or taking meds regularly.    Outpatient Encounter Medications as of 01/16/2024  Medication Sig   acetaminophen  (TYLENOL ) 325 MG tablet Take 2 tablets (650 mg total) by mouth every 4 (four) hours as needed (for pain scale < 4).   ferrous sulfate  (FERROUSUL) 325 (65 FE) MG tablet Take 1 tablet (325 mg total) by mouth every other day.   ibuprofen  (ADVIL ) 600 MG tablet Take 1 tablet (600 mg total) by mouth every 6 (six) hours.   norgestimate -ethinyl estradiol  (ORTHO-CYCLEN) 0.25-35 MG-MCG tablet Take 1 tablet by mouth daily.   No facility-administered encounter medications on file as of 01/16/2024.    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    Past Surgical History:  Procedure Laterality Date   NO PAST SURGERIES      Family History  Problem Relation Age of Onset   Healthy Mother    Healthy Father     Social History   Socioeconomic History   Marital status: Single    Spouse name: Not on file   Number of children: Not on file   Years of education: Not on file   Highest education level: Associate degree: occupational, Scientist, product/process development, or vocational program  Occupational History   Not on file  Tobacco Use   Smoking status: Never   Smokeless tobacco: Never  Vaping Use   Vaping status: Never Used  Substance and Sexual Activity   Alcohol use: No   Drug use: No   Sexual activity: Not Currently    Birth control/protection: None  Other Topics Concern   Not on file  Social History Narrative   Not on file   Social Drivers of Health    Financial Resource Strain: Low Risk  (01/15/2024)   Overall Financial Resource Strain (CARDIA)    Difficulty of Paying Living Expenses: Not hard at all  Food Insecurity: No Food Insecurity (01/15/2024)   Hunger Vital Sign    Worried About Running Out of Food in the Last Year: Never true    Ran Out of Food in the Last Year: Never true  Transportation Needs: No Transportation Needs (01/15/2024)   PRAPARE - Administrator, Civil Service (Medical): No    Lack of Transportation (Non-Medical): No  Physical Activity: Insufficiently Active (01/15/2024)   Exercise Vital Sign    Days of Exercise per Week: 2 days    Minutes of Exercise per Session: 50 min  Stress: No Stress Concern Present (01/15/2024)   [employer] of Occupational Health - Occupational Stress Questionnaire    Feeling of Stress : Only a little  Social Connections: Socially Integrated (01/15/2024)   Social Connection and Isolation Panel [NHANES]    Frequency of Communication with Friends and Family: More than three times a week    Frequency of Social Gatherings with Friends and Family: Three times a week    Attends Religious Services: More than 4 times per year    Active Member of Clubs or  Organizations: Yes    Attends Engineer, structural: More than 4 times per year    Marital Status: Married  Catering manager Violence: Not At Risk (08/18/2020)   Humiliation, Afraid, Rape, and Kick questionnaire    Fear of Current or Ex-Partner: No    Emotionally Abused: No    Physically Abused: No    Sexually Abused: No    Review of Systems  All other systems reviewed and are negative.       Objective   BP 114/70 (BP Location: Left Arm, Patient Position: Sitting, Cuff Size: Normal)   Pulse 81   Ht 5\' 7"  (1.702 m)   Wt 167 lb 6.4 oz (75.9 kg)   LMP 01/10/2024   SpO2 97%   Breastfeeding No   BMI 26.22 kg/m   Physical Exam Vitals and nursing note reviewed.  Constitutional:      General: She is not in  acute distress. HENT:     Head: Normocephalic and atraumatic.     Right Ear: Tympanic membrane, ear canal and external ear normal.     Left Ear: Tympanic membrane, ear canal and external ear normal.     Nose: Nose normal.     Mouth/Throat:     Mouth: Mucous membranes are moist.     Pharynx: Oropharynx is clear.  Eyes:     Conjunctiva/sclera: Conjunctivae normal.     Pupils: Pupils are equal, round, and reactive to light.  Neck:     Thyroid: No thyromegaly.  Cardiovascular:     Rate and Rhythm: Normal rate and regular rhythm.     Heart sounds: Normal heart sounds. No murmur heard. Pulmonary:     Effort: Pulmonary effort is normal. No respiratory distress.     Breath sounds: Normal breath sounds.  Abdominal:     General: There is no distension.     Palpations: Abdomen is soft. There is no mass.     Tenderness: There is no abdominal tenderness.  Musculoskeletal:        General: Normal range of motion.     Cervical back: Normal range of motion and neck supple.  Skin:    General: Skin is warm and dry.  Neurological:     General: No focal deficit present.     Mental Status: She is alert and oriented to person, place, and time.  Psychiatric:        Mood and Affect: Mood normal.        Behavior: Behavior normal.         Assessment & Plan:   Annual physical exam -     CMP14+EGFR  Encounter for initial prescription of contraceptive pills  Screening for deficiency anemia -     CBC with Differential/Platelet  Screening for lipid disorders -     Lipid panel  Encounter to establish care  Other orders -     Norgestimate -Eth Estradiol ; Take 1 tablet by mouth daily.  Dispense: 84 tablet; Refill: 0     Return if symptoms worsen or fail to improve.   Arlo Lama, MD

## 2024-01-17 LAB — CBC WITH DIFFERENTIAL/PLATELET
Basophils Absolute: 0.1 10*3/uL (ref 0.0–0.2)
Basos: 1 %
EOS (ABSOLUTE): 0.1 10*3/uL (ref 0.0–0.4)
Eos: 2 %
Hematocrit: 28.2 % — ABNORMAL LOW (ref 34.0–46.6)
Hemoglobin: 7.6 g/dL — ABNORMAL LOW (ref 11.1–15.9)
Immature Grans (Abs): 0 10*3/uL (ref 0.0–0.1)
Immature Granulocytes: 0 %
Lymphocytes Absolute: 1.9 10*3/uL (ref 0.7–3.1)
Lymphs: 28 %
MCH: 19.1 pg — ABNORMAL LOW (ref 26.6–33.0)
MCHC: 27 g/dL — ABNORMAL LOW (ref 31.5–35.7)
MCV: 71 fL — ABNORMAL LOW (ref 79–97)
Monocytes Absolute: 0.3 10*3/uL (ref 0.1–0.9)
Monocytes: 5 %
Neutrophils Absolute: 4.5 10*3/uL (ref 1.4–7.0)
Neutrophils: 64 %
Platelets: 640 10*3/uL — ABNORMAL HIGH (ref 150–450)
RBC: 3.97 x10E6/uL (ref 3.77–5.28)
RDW: 18.3 % — ABNORMAL HIGH (ref 11.7–15.4)
WBC: 6.9 10*3/uL (ref 3.4–10.8)

## 2024-01-17 LAB — CMP14+EGFR
ALT: 18 IU/L (ref 0–32)
AST: 30 IU/L (ref 0–40)
Albumin: 4.5 g/dL (ref 4.0–5.0)
Alkaline Phosphatase: 70 IU/L (ref 44–121)
BUN/Creatinine Ratio: 15 (ref 9–23)
BUN: 9 mg/dL (ref 6–20)
Bilirubin Total: 0.3 mg/dL (ref 0.0–1.2)
CO2: 20 mmol/L (ref 20–29)
Calcium: 9.8 mg/dL (ref 8.7–10.2)
Chloride: 102 mmol/L (ref 96–106)
Creatinine, Ser: 0.59 mg/dL (ref 0.57–1.00)
Globulin, Total: 3 g/dL (ref 1.5–4.5)
Glucose: 82 mg/dL (ref 70–99)
Potassium: 4.7 mmol/L (ref 3.5–5.2)
Sodium: 136 mmol/L (ref 134–144)
Total Protein: 7.5 g/dL (ref 6.0–8.5)
eGFR: 131 mL/min/{1.73_m2} (ref >59)

## 2024-01-17 LAB — LIPID PANEL
Chol/HDL Ratio: 3.1 ratio (ref 0.0–4.4)
Cholesterol, Total: 138 mg/dL (ref 100–199)
HDL: 45 mg/dL (ref >39)
LDL Chol Calc (NIH): 82 mg/dL (ref 0–99)
Triglycerides: 50 mg/dL (ref 0–149)
VLDL Cholesterol Cal: 11 mg/dL (ref 5–40)

## 2024-02-02 ENCOUNTER — Emergency Department (HOSPITAL_COMMUNITY)

## 2024-02-02 ENCOUNTER — Other Ambulatory Visit: Payer: Self-pay

## 2024-02-02 ENCOUNTER — Emergency Department (HOSPITAL_COMMUNITY): Admission: EM | Admit: 2024-02-02 | Discharge: 2024-02-02 | Disposition: A | Discharge status: Home / Self Care

## 2024-02-02 ENCOUNTER — Encounter (HOSPITAL_COMMUNITY): Payer: Self-pay

## 2024-02-02 DIAGNOSIS — D509 Iron deficiency anemia, unspecified: Secondary | ICD-10-CM | POA: Insufficient documentation

## 2024-02-02 DIAGNOSIS — N939 Abnormal uterine and vaginal bleeding, unspecified: Secondary | ICD-10-CM | POA: Insufficient documentation

## 2024-02-02 DIAGNOSIS — R109 Unspecified abdominal pain: Secondary | ICD-10-CM

## 2024-02-02 LAB — CBC
HCT: 26.2 % — ABNORMAL LOW (ref 36.0–46.0)
Hemoglobin: 7.2 g/dL — ABNORMAL LOW (ref 12.0–15.0)
MCH: 20.1 pg — ABNORMAL LOW (ref 26.0–34.0)
MCHC: 27.5 g/dL — ABNORMAL LOW (ref 30.0–36.0)
MCV: 73.2 fL — ABNORMAL LOW (ref 80.0–100.0)
Platelets: 370 10*3/uL (ref 150–400)
RBC: 3.58 MIL/uL — ABNORMAL LOW (ref 3.87–5.11)
RDW: 24.9 % — ABNORMAL HIGH (ref 11.5–15.5)
WBC: 7.1 10*3/uL (ref 4.0–10.5)
nRBC: 0 % (ref 0.0–0.2)

## 2024-02-02 LAB — HCG, SERUM, QUALITATIVE: Preg, Serum: NEGATIVE

## 2024-02-02 NOTE — Discharge Instructions (Addendum)
 Keep your scheduled follow-up appointment with your primary care provider.  Continue your iron  supplementation as previously directed.  Return to the emergency department if your symptoms worsen or if you develop a fever.

## 2024-02-02 NOTE — ED Notes (Signed)
 Lab contacted and will add on HCG quantitative

## 2024-02-02 NOTE — ED Triage Notes (Addendum)
 Pt was having spotting yesterday and this morning began having pelvic cramping and passing clots. Pt has had pos pregnancy tests at home. LMP 4/16

## 2024-02-02 NOTE — ED Provider Notes (Signed)
 [facility] AT [facility] Provider Note   CSN: [id number] Arrival date & time: 02/02/24  4696     History  Chief Complaint  Patient presents with   Vaginal Bleeding    Beverly Mckenzie is a 23 y.o. female.  23 year old female presenting with vaginal bleeding/cramping.  Vaginal bleeding began today, she has had "cramping sensation" for several days but today she describes it "like contractions".  She reports that her vaginal bleeding was heavy, causing her to bleed through her close, she also passed round/long clots.  Patient had a positive home pregnancy test on Monday, was seen by her primary care provider and was found to have microcytic anemia, she was started on iron  on Tuesday.  Patient has follow-up with her primary care provider scheduled for Thursday since starting iron  supplementation.  She denies fevers, dysuria, vomiting/diarrhea, dizziness/lightheadedness/loss of consciousness.  Has had mild nausea but not currently. LMP around April 16.    Vaginal Bleeding      Home Medications Prior to Admission medications   Medication Sig Start Date End Date Taking? Authorizing Provider  acetaminophen  (TYLENOL ) 325 MG tablet Take 2 tablets (650 mg total) by mouth every 4 (four) hours as needed (for pain scale < 4). 04/15/22   Ozan, Jennifer, DO  ferrous sulfate  (FERROUSUL) 325 (65 FE) MG tablet Take 1 tablet (325 mg total) by mouth every other day. 03/26/22   Lacey Pian, MD  ibuprofen  (ADVIL ) 600 MG tablet Take 1 tablet (600 mg total) by mouth every 6 (six) hours. 04/15/22   Ozan, Jennifer, DO  norgestimate -ethinyl estradiol  (ORTHO-CYCLEN) 0.25-35 MG-MCG tablet Take 1 tablet by mouth daily. 01/16/24   Abraham Abo, MD      Allergies    Justicia adhatoda (malabar nut tree) [justicia adhatoda], Cat dander, and Dog epithelium (canis lupus familiaris)    Review of Systems   Review of Systems  Genitourinary:  Positive for vaginal bleeding.     Physical Exam Updated Vital Signs BP 121/79   Pulse 89   Temp 98.6 F (37 C)   Resp 16   Ht 5\' 7"  (1.702 m)   Wt 74.8 kg   LMP 12/11/2023   SpO2 98%   BMI 25.84 kg/m  Physical Exam Vitals and nursing note reviewed.  HENT:     Head: Normocephalic.  Eyes:     Extraocular Movements: Extraocular movements intact.     Pupils: Pupils are equal, round, and reactive to light.  Cardiovascular:     Rate and Rhythm: Normal rate and regular rhythm.     Heart sounds: Normal heart sounds.  Pulmonary:     Effort: Pulmonary effort is normal.     Breath sounds: Normal breath sounds.  Abdominal:     Palpations: Abdomen is soft.     Tenderness: There is abdominal tenderness (suprapubic).  Musculoskeletal:     Cervical back: Normal range of motion.     Right lower leg: No edema.     Left lower leg: No edema.     Comments: Moves all extremities spontaneously without difficulty  Skin:    General: Skin is warm and dry.  Neurological:     General: No focal deficit present.     Mental Status: She is alert.    ED Results / Procedures / Treatments   Labs (all labs ordered are listed, but only abnormal results are displayed) Labs Reviewed  CBC - Abnormal; Notable for the following components:      Result Value  RBC 3.58 (*)    Hemoglobin 7.2 (*)    HCT 26.2 (*)    MCV 73.2 (*)    MCH 20.1 (*)    MCHC 27.5 (*)    RDW 24.9 (*)    All other components within normal limits  HCG, SERUM, QUALITATIVE    EKG None  Radiology US  PELVIC COMPLETE W TRANSVAGINAL AND TORSION R/O Result Date: 02/02/2024 CLINICAL DATA:  [id number] Vaginal bleeding [id number] Pelvic cramping [id number]. EXAM: TRANSABDOMINAL AND TRANSVAGINAL ULTRASOUND OF PELVIS DOPPLER ULTRASOUND OF OVARIES TECHNIQUE: Both transabdominal and transvaginal ultrasound examinations of the pelvis were performed. Transabdominal technique was performed for global imaging of the pelvis including uterus, ovaries, adnexal regions, and  pelvic cul-de-sac. It was necessary to proceed with endovaginal exam following the transabdominal exam to visualize the uterus, endometrium, bilateral ovaries and bilateral adnexa. Color and duplex Doppler ultrasound was utilized to evaluate blood flow to the ovaries. COMPARISON:  None Available. FINDINGS: Uterus Measurements: 4.6 x 5.8 x 9.3 cm = volume: 130.1 mL. No fibroids or other mass visualized. Endometrium Thickness: Up to 3.3 mm.  No focal abnormality visualized. Right ovary Measurements: 2.4 x 2.6 x 4.0 cm = volume: 13.4 mL. There is a 2.0 x 2.2 x 2.4 cm dominant follicle in the right ovary. Right ovary is otherwise within normal limits. Left ovary Measurements: 1.7 x 1.8 x 2.9 cm = volume: 4.9 mL. Normal appearance/no adnexal mass. Pulsed Doppler evaluation of both ovaries demonstrates normal low-resistance arterial and venous waveforms. Other findings No abnormal free fluid. IMPRESSION: *Unremarkable pelvic ultrasound exam. Electronically Signed   By: Beula Brunswick M.D.   On: 02/02/2024 15:16    Procedures Procedures    Medications Ordered in ED Medications - No data to display  ED Course/ Medical Decision Making/ A&P                                 Medical Decision Making This patient presents to the ED for concern of vaginal bleeding/cramping, this involves an extensive number of treatment options, and is a complaint that carries with it a high risk of complications and morbidity.  The differential diagnosis includes complete/incomplete abortion, retained products of conception, anemia, septic abortion.    Co morbidities that complicate the patient evaluation  Anemia    Additional history obtained:  Additional history obtained from record review External records from outside source obtained and reviewed including recent lab results   Lab Tests:  I Ordered, and personally interpreted labs.  The pertinent results include: CBC notable for microcytic anemia with hemoglobin  of 7.2, however this is similar as compared to patient's baseline readings from 2 weeks ago.  Serum hCG negative.   Imaging Studies ordered:  I ordered imaging studies including TVUS  I independently visualized and interpreted imaging which showed Unremarkable pelvic ultrasound exam.  I agree with the radiologist interpretation   Cardiac Monitoring: / EKG:  The patient was maintained on a cardiac monitor.  I personally viewed and interpreted the cardiac monitored which showed an underlying rhythm of: normal sinus rhythm   Problem List / ED Course / Critical interventions / Medication management I have reviewed the patients home medicines and have made adjustments as needed   Social Determinants of Health:  Housing instability   Test / Admission - Considered:  Physical exam is notable for suprapubic abdominal tenderness to palpation.  CBC is notable for microcytic anemia, however patient was  recently started on iron  supplements this week by her PCP, her microcytic anemia is relatively stable as compared to value from 2 weeks ago, given this fact and reassuring physical exam with no history of dizziness/fatigue/loss of consciousness, I do not feel that transfusion is necessary in her case, microcytic anemia is likely chronic in nature.  Given that patient is experiencing heavy vaginal bleeding as well as lower abdominal cramping, I do feel that a pelvic ultrasound would help identify any retained products of conception that may sold in complications down the line, however patient's appearance is generally reassuring, she does not have a fever nor does she demonstrate leukocytosis on her CBC.  I discussed this in depth with patient, using shared decision making patient does request pelvic ultrasound for further evaluation of this issue, I feel that this is a reasonable plan.  Results are reassuring as above.  Advised patient to keep scheduled follow-up with her primary care provider, continue  iron  supplementation as previously directed.  Strict return precautions discussed, patient voiced understanding and is in agreement with this plan.    Amount and/or Complexity of Data Reviewed Radiology: ordered.           Final Clinical Impression(s) / ED Diagnoses Final diagnoses:  Vaginal bleeding  Abdominal cramping  Microcytic anemia    Rx / DC Orders ED Discharge Orders     None         Kendrick Pax, PA-C 02/02/24 1527    Rolinda Climes, DO 02/03/24 [phone]

## 2024-02-04 ENCOUNTER — Encounter: Payer: Self-pay | Admitting: Family Medicine

## 2024-02-05 ENCOUNTER — Ambulatory Visit: Admitting: Physician Assistant

## 2024-02-05 NOTE — Progress Notes (Deleted)
 Patient ID: Beverly Mckenzie, female   DOB: 2001/02/26, 23 y.o.   MRN: [id number]   After being seen in ED with heavy vaginal bleeding 23 year old female presenting with vaginal bleeding/cramping. Vaginal bleeding began today, she has had cramping sensation for several days but today she describes it like contractions. She reports that her vaginal bleeding was heavy, causing her to bleed through her close, she also passed round/long clots. Patient had a positive home pregnancy test on Monday, was seen by her primary care provider and was found to have microcytic anemia, she was started on iron  on Tuesday. Patient has follow-up with her primary care provider scheduled for Thursday since starting iron  supplementation. She denies fevers, dysuria, vomiting/diarrhea, dizziness/lightheadedness/loss of consciousness. Has had mild nausea but not currently. LMP around April 16.    I Ordered, and personally interpreted labs.  The pertinent results include: CBC notable for microcytic anemia with hemoglobin of 7.2, however this is similar as compared to patient's baseline readings from 2 weeks ago.  Serum hCG negative.     Imaging Studies ordered:   I ordered imaging studies including TVUS  I independently visualized and interpreted imaging which showed Unremarkable pelvic ultrasound exam.  Problem List / ED Course / Critical interventions / Medication management I have reviewed the patients home medicines and have made adjustments as needed     Social Determinants of Health:   Housing instability     Test / Admission - Considered:   Physical exam is notable for suprapubic abdominal tenderness to palpation.  CBC is notable for microcytic anemia, however patient was recently started on iron  supplements this week by her PCP, her microcytic anemia is relatively stable as compared to value from 2 weeks ago, given this fact and reassuring physical exam with no history of dizziness/fatigue/loss of  consciousness, I do not feel that transfusion is necessary in her case, microcytic anemia is likely chronic in nature.  Given that patient is experiencing heavy vaginal bleeding as well as lower abdominal cramping, I do feel that a pelvic ultrasound would help identify any retained products of conception that may sold in complications down the line, however patient's appearance is generally reassuring, she does not have a fever nor does she demonstrate leukocytosis on her CBC.  I discussed this in depth with patient, using shared decision making patient does request pelvic ultrasound for further evaluation of this issue, I feel that this is a reasonable plan.  Results are reassuring as above.  Advised patient to keep scheduled follow-up with her primary care provider, continue iron  supplementation as previously directed.  Strict return precautions discussed, patient voiced understanding and is in agreement with this plan.

## 2024-02-10 ENCOUNTER — Other Ambulatory Visit: Payer: Self-pay | Admitting: Family Medicine

## 2024-02-10 ENCOUNTER — Ambulatory Visit: Payer: Self-pay | Admitting: Family Medicine

## 2024-02-10 DIAGNOSIS — N938 Other specified abnormal uterine and vaginal bleeding: Secondary | ICD-10-CM

## 2024-02-10 DIAGNOSIS — D500 Iron deficiency anemia secondary to blood loss (chronic): Secondary | ICD-10-CM

## 2024-03-10 ENCOUNTER — Ambulatory Visit: Admitting: Family Medicine

## 2024-04-17 ENCOUNTER — Ambulatory Visit: Payer: Self-pay | Admitting: Family Medicine

## 2024-04-17 ENCOUNTER — Ambulatory Visit: Admitting: Family Medicine

## 2024-04-17 VITALS — BP 108/68 | HR 78 | Temp 98.1°F | Resp 14 | Ht 67.0 in | Wt 177.0 lb

## 2024-04-17 DIAGNOSIS — N912 Amenorrhea, unspecified: Secondary | ICD-10-CM | POA: Diagnosis not present

## 2024-04-17 DIAGNOSIS — D509 Iron deficiency anemia, unspecified: Secondary | ICD-10-CM

## 2024-04-17 DIAGNOSIS — D649 Anemia, unspecified: Secondary | ICD-10-CM | POA: Diagnosis not present

## 2024-04-17 DIAGNOSIS — Z3009 Encounter for other general counseling and advice on contraception: Secondary | ICD-10-CM

## 2024-04-17 LAB — POCT URINE PREGNANCY: Preg Test, Ur: NEGATIVE

## 2024-04-17 MED ORDER — NORETHINDRONE ACET-ETHINYL EST 1-20 MG-MCG PO TABS: 1.0000 | ORAL_TABLET | Freq: Every day | ORAL | 11 refills | Status: AC

## 2024-04-17 NOTE — Progress Notes (Signed)
 Established Patient Office Visit  Subjective    Patient ID: Beverly Mckenzie, female    DOB: May 29, 2001  Age: 23 y.o. MRN: [id number]  CC:  Chief Complaint  Patient presents with   Follow-up    HPI Beverly Mckenzie presents for follow up of contraception. She says that she stopped taking it about 1 month ago 2/2 nausea. Patient has tried depo, nexplanon , and ocp but had sx with all.   Outpatient Encounter Medications as of 04/17/2024  Medication Sig   acetaminophen  (TYLENOL ) 325 MG tablet Take 2 tablets (650 mg total) by mouth every 4 (four) hours as needed (for pain scale < 4).   ferrous sulfate  (FERROUSUL) 325 (65 FE) MG tablet Take 1 tablet (325 mg total) by mouth every other day.   ibuprofen  (ADVIL ) 600 MG tablet Take 1 tablet (600 mg total) by mouth every 6 (six) hours.   norethindrone -ethinyl estradiol  (LOESTRIN 1/20, 21,) 1-20 MG-MCG tablet Take 1 tablet by mouth daily.   [DISCONTINUED] norgestimate -ethinyl estradiol  (ORTHO-CYCLEN) 0.25-35 MG-MCG tablet Take 1 tablet by mouth daily.   No facility-administered encounter medications on file as of 04/17/2024.    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    Past Surgical History:  Procedure Laterality Date   NO PAST SURGERIES      Family History  Problem Relation Age of Onset   Healthy Mother    Healthy Father     Social History   Socioeconomic History   Marital status: Single    Spouse name: Not on file   Number of children: Not on file   Years of education: Not on file   Highest education level: Associate degree: academic program  Occupational History   Not on file  Tobacco Use   Smoking status: Never   Smokeless tobacco: Never  Vaping Use   Vaping status: Never Used  Substance and Sexual Activity   Alcohol use: No   Drug use: No   Sexual activity: Not Currently    Birth control/protection: None  Other Topics  Concern   Not on file  Social History Narrative   Not on file   Social Drivers of Health   Financial Resource Strain: Low Risk  (04/17/2024)   Overall Financial Resource Strain (CARDIA)    Difficulty of Paying Living Expenses: Not hard at all  Food Insecurity: No Food Insecurity (04/17/2024)   Hunger Vital Sign    Worried About Running Out of Food in the Last Year: Never true    Ran Out of Food in the Last Year: Never true  Transportation Needs: No Transportation Needs (04/17/2024)   PRAPARE - Transportation    Lack of Transportation (Medical): No    Lack of Transportation (Non-Medical): No  Physical Activity: Insufficiently Active (04/17/2024)   Exercise Vital Sign    Days of Exercise per Week: 2 days    Minutes of Exercise per Session: 30 min  Stress: No Stress Concern Present (04/17/2024)   [employer] of Occupational Health - Occupational Stress Questionnaire    Feeling of Stress: Only a little  Social Connections: Socially Integrated (04/17/2024)   Social Connection and Isolation Panel    Frequency of Communication with Friends and Family: More than three times a week    Frequency of Social Gatherings with Friends and Family: Three times a week    Attends Religious Services: More than  4 times per year    Active Member of Clubs or Organizations: Yes    Attends Banker Meetings: More than 4 times per year    Marital Status: Married  Catering manager Violence: Not At Risk (08/18/2020)   Humiliation, Afraid, Rape, and Kick questionnaire    Fear of Current or Ex-Partner: No    Emotionally Abused: No    Physically Abused: No    Sexually Abused: No    Review of Systems  All other systems reviewed and are negative.       Objective    BP 108/68 (BP Location: Right Arm, Patient Position: Sitting, Cuff Size: Normal)   Pulse 78   Temp 98.1 F (36.7 C) (Oral)   Resp 14   Ht 5' 7 (1.702 m)   Wt 177 lb (80.3 kg)   LMP  (LMP Unknown)   SpO2 99%   BMI  27.72 kg/m   Physical Exam Vitals and nursing note reviewed.  Constitutional:      General: She is not in acute distress. Cardiovascular:     Rate and Rhythm: Normal rate and regular rhythm.  Pulmonary:     Effort: Pulmonary effort is normal.     Breath sounds: Normal breath sounds.  Abdominal:     Palpations: Abdomen is soft.     Tenderness: There is no abdominal tenderness.  Neurological:     General: No focal deficit present.     Mental Status: She is alert and oriented to person, place, and time.         Assessment & Plan:   Encounter for other general counseling or advice on contraception  Amenorrhea -     POCT urine pregnancy  Anemia, unspecified type -     CBC with Differential/Platelet -     Iron , TIBC and Ferritin Panel  Other orders -     Norethindrone  Acet-Ethinyl Est; Take 1 tablet by mouth daily.  Dispense: 28 tablet; Refill: 11     No follow-ups on file.   Tanda Raguel SQUIBB, MD

## 2024-04-18 LAB — CBC WITH DIFFERENTIAL/PLATELET
Basophils Absolute: 0.1 x10E3/uL (ref 0.0–0.2)
Basos: 1 %
EOS (ABSOLUTE): 0.1 x10E3/uL (ref 0.0–0.4)
Eos: 3 %
Hematocrit: 25.9 % — ABNORMAL LOW (ref 34.0–46.6)
Hemoglobin: 7.1 g/dL — ABNORMAL LOW (ref 11.1–15.9)
Immature Grans (Abs): 0 x10E3/uL (ref 0.0–0.1)
Immature Granulocytes: 0 %
Lymphocytes Absolute: 1.6 x10E3/uL (ref 0.7–3.1)
Lymphs: 38 %
MCH: 19.9 pg — ABNORMAL LOW (ref 26.6–33.0)
MCHC: 27.4 g/dL — ABNORMAL LOW (ref 31.5–35.7)
MCV: 73 fL — ABNORMAL LOW (ref 79–97)
Monocytes Absolute: 0.3 x10E3/uL (ref 0.1–0.9)
Monocytes: 6 %
Neutrophils Absolute: 2.3 x10E3/uL (ref 1.4–7.0)
Neutrophils: 51 %
Platelets: 332 x10E3/uL (ref 150–450)
RBC: 3.57 x10E6/uL — ABNORMAL LOW (ref 3.77–5.28)
RDW: 18.9 % — ABNORMAL HIGH (ref 11.7–15.4)
WBC: 4.4 x10E3/uL (ref 3.4–10.8)

## 2024-04-18 LAB — IRON,TIBC AND FERRITIN PANEL
Ferritin: 5 ng/mL — ABNORMAL LOW (ref 15–150)
Iron Saturation: 3 % — CL (ref 15–55)
Iron: 18 ug/dL — ABNORMAL LOW (ref 27–159)
Total Iron Binding Capacity: 515 ug/dL — ABNORMAL HIGH (ref 250–450)
UIBC: 497 ug/dL — ABNORMAL HIGH (ref 131–425)

## 2024-04-21 ENCOUNTER — Encounter: Payer: Self-pay | Admitting: Family Medicine

## 2024-04-22 ENCOUNTER — Other Ambulatory Visit: Payer: Self-pay | Admitting: Family Medicine

## 2024-04-30 ENCOUNTER — Inpatient Hospital Stay: Attending: Hematology and Oncology | Admitting: Hematology and Oncology

## 2024-04-30 ENCOUNTER — Inpatient Hospital Stay

## 2024-04-30 DIAGNOSIS — R5383 Other fatigue: Secondary | ICD-10-CM | POA: Insufficient documentation

## 2024-04-30 DIAGNOSIS — D509 Iron deficiency anemia, unspecified: Secondary | ICD-10-CM | POA: Insufficient documentation

## 2024-04-30 DIAGNOSIS — J45909 Unspecified asthma, uncomplicated: Secondary | ICD-10-CM | POA: Insufficient documentation

## 2024-05-20 ENCOUNTER — Inpatient Hospital Stay: Admitting: Hematology and Oncology

## 2024-05-20 ENCOUNTER — Inpatient Hospital Stay

## 2024-05-20 VITALS — BP 103/62 | HR 70 | Temp 98.5°F | Resp 16 | Wt 184.0 lb

## 2024-05-20 DIAGNOSIS — J45909 Unspecified asthma, uncomplicated: Secondary | ICD-10-CM | POA: Diagnosis not present

## 2024-05-20 DIAGNOSIS — R5383 Other fatigue: Secondary | ICD-10-CM | POA: Diagnosis not present

## 2024-05-20 DIAGNOSIS — D509 Iron deficiency anemia, unspecified: Secondary | ICD-10-CM

## 2024-05-20 NOTE — Progress Notes (Signed)
 [facility] CONSULT NOTE  Patient Care Team: Tanda Bleacher, MD as PCP - General (Family Medicine)  CHIEF COMPLAINTS/PURPOSE OF CONSULTATION:   IDA  ASSESSMENT & PLAN:   This is a very pleasant 23 yr old female pt with no significant past medical history referred to hematology for evaluation and recommendations for IDA  Iron  deficiency anemia affects a large proportion of the wellness population especially females of childbearing age and children.  Common causes of iron  deficiency include blood loss, reduced iron  absorption because of previous surgeries, dietary restrictions or other malabsorption issues, medications that reduce gastric acidity are due to inherited disorders such as IRIDA due to TMPRSS6 mutation. Symptoms of iron  deficiency usually include fatigue, pica, restless legs, exercise intolerance, exertional dyspnea, headaches and weakness. Diagnosis can be usually made by history, physical examination, CBC and iron  studies.  We generally treat iron  deficiency anemia with oral supplements if this can be tolerated.  IV iron  is appropriate for patients are unable to tolerate due to gastrointestinal side effects or for patients with severe/ongoing blood loss, history of gastric bypass which reduces gastric acid and henceforth will impair intestinal absorption of oral iron , malabsorption syndromes are occasional in pregnancy. In this pt, given severe anemia and poor tolerance to oral iron , it is reasonable to proceed with IV iron . There are several formularies of intravenous iron  available in the market.  We have discussed about risk of allergic/infusion reactions including potentially life-threatening anaphylaxis with intravenous iron  however these serious allergic reactions are exceedingly rare and overestimated.  In contrast to serious allergic reactions, IV iron  may be associated with nonallergic infusion reactions including self-limited urticaria, palpitations, dizziness,  neck and back spasm which again occur in less than 1% of the individuals and do not progress to more serious reactions.  She previously received venofer  and tolerated it well. She is willing to try this again.  She will RTC in 3 months with labs. HISTORY OF PRESENTING ILLNESS:  Beverly Mckenzie 23 y.o. female is here because of IDA  Discussed the use of AI scribe software for clinical note transcription with the patient, who gave verbal consent to proceed.  History of Present Illness Beverly Mckenzie is a 23 year old female who presents with fatigue and hair loss.  She experiences significant fatigue and hair loss, which she attributes to her anemia. She has a persistent craving for cornstarch and cleaning products. She has been aware of her anemia since the birth of her first child, who is now three years old.  She has attempted to take iron  supplements in the past but experienced severe nausea despite taking them with food. Due to these side effects, she has not been taking any iron  supplements recently. She recalls receiving an iron  infusion after the birth of her first child without any allergic reactions.  Her menstrual cycles have become notably heavier since childbirth, contributing to her fatigue. She recently completed her menstrual period and feels particularly drained, especially as she manages her household and two young children while her husband works nights.  She has a history of asthma, which is currently well-controlled. She does not smoke or drink alcohol. She has tried various forms of birth control without success and currently uses condoms. She reports brittle nails that easily break.  No blood in stool or urine, no black stool, and no blood in urine. She has been taking prenatal vitamins. She has noticed a rapid weight gain recently.  All other systems were reviewed with the patient  and are negative.  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    SURGICAL HISTORY: Past Surgical History:  Procedure Laterality Date   NO PAST SURGERIES      SOCIAL HISTORY: Social History   Socioeconomic History   Marital status: Single    Spouse name: Not on file   Number of children: Not on file   Years of education: Not on file   Highest education level: Associate degree: academic program  Occupational History   Not on file  Tobacco Use   Smoking status: Never   Smokeless tobacco: Never  Vaping Use   Vaping status: Never Used  Substance and Sexual Activity   Alcohol use: No   Drug use: No   Sexual activity: Not Currently    Birth control/protection: None  Other Topics Concern   Not on file  Social History Narrative   Not on file   Social Drivers of Health   Financial Resource Strain: Low Risk  (04/17/2024)   Overall Financial Resource Strain (CARDIA)    Difficulty of Paying Living Expenses: Not hard at all  Food Insecurity: No Food Insecurity (04/17/2024)   Hunger Vital Sign    Worried About Running Out of Food in the Last Year: Never true    Ran Out of Food in the Last Year: Never true  Transportation Needs: No Transportation Needs (04/17/2024)   PRAPARE - Transportation    Lack of Transportation (Medical): No    Lack of Transportation (Non-Medical): No  Physical Activity: Insufficiently Active (04/17/2024)   Exercise Vital Sign    Days of Exercise per Week: 2 days    Minutes of Exercise per Session: 30 min  Stress: No Stress Concern Present (04/17/2024)   [employer] of Occupational Health - Occupational Stress Questionnaire    Feeling of Stress: Only a little  Social Connections: Socially Integrated (04/17/2024)   Social Connection and Isolation Panel    Frequency of Communication with Friends and Family: More than three times a week    Frequency of Social Gatherings with Friends and Family: Three times a week    Attends  Religious Services: More than 4 times per year    Active Member of Clubs or Organizations: Yes    Attends Banker Meetings: More than 4 times per year    Marital Status: Married  Catering manager Violence: Not At Risk (08/18/2020)   Humiliation, Afraid, Rape, and Kick questionnaire    Fear of Current or Ex-Partner: No    Emotionally Abused: No    Physically Abused: No    Sexually Abused: No    FAMILY HISTORY: Family History  Problem Relation Age of Onset   Healthy Mother    Healthy Father     ALLERGIES:  is allergic to [place] ([place] nut tree) [justicia adhatoda], cat dander, and dog epithelium (canis lupus familiaris).  MEDICATIONS:  Current Outpatient Medications  Medication Sig Dispense Refill   acetaminophen  (TYLENOL ) 325 MG tablet Take 2 tablets (650 mg total) by mouth every 4 (four) hours as needed (for pain scale < 4).     ferrous sulfate  (FERROUSUL) 325 (65 FE) MG tablet Take 1 tablet (325 mg total) by mouth every other day. 90 tablet 1   ibuprofen  (ADVIL ) 600 MG tablet Take 1 tablet (600 mg total) by mouth every 6 (six) hours. 30  tablet 0   norethindrone -ethinyl estradiol  (LOESTRIN 1/20, 21,) 1-20 MG-MCG tablet Take 1 tablet by mouth daily. 28 tablet 11   No current facility-administered medications for this visit.     PHYSICAL EXAMINATION: ECOG PERFORMANCE STATUS: 0 - Asymptomatic  Vitals:   05/20/24 1556  BP: 103/62  Pulse: 70  Resp: 16  Temp: 98.5 F (36.9 C)  SpO2: 100%   Filed Weights   05/20/24 1556  Weight: 184 lb (83.5 kg)    GENERAL:alert, no distress and comfortable SKIN: skin color, texture, turgor are normal, no rashes or significant lesions EYES: normal, conjunctiva are pink and non-injected, sclera clear OROPHARYNX:no exudate, no erythema and lips, buccal mucosa, and tongue normal  NECK: supple, thyroid normal size, non-tender, without nodularity LYMPH:  no palpable lymphadenopathy in the cervical, axillary   LUNGS: clear to auscultation and percussion with normal breathing effort HEART: regular rate & rhythm and no murmurs and no lower extremity edema ABDOMEN:abdomen soft, non-tender and normal bowel sounds Musculoskeletal:no cyanosis of digits and no clubbing  PSYCH: alert & oriented x 3 with fluent speech NEURO: no focal motor/sensory deficits  LABORATORY DATA:  I have reviewed the data as listed Lab Results  Component Value Date   WBC 4.4 04/17/2024   HGB 7.1 (L) 04/17/2024   HCT 25.9 (L) 04/17/2024   MCV 73 (L) 04/17/2024   PLT 332 04/17/2024     Chemistry      Component Value Date/Time   NA 136 01/16/2024 1334   K 4.7 01/16/2024 1334   CL 102 01/16/2024 1334   CO2 20 01/16/2024 1334   BUN 9 01/16/2024 1334   CREATININE 0.59 01/16/2024 1334      Component Value Date/Time   CALCIUM 9.8 01/16/2024 1334   ALKPHOS 70 01/16/2024 1334   AST 30 01/16/2024 1334   ALT 18 01/16/2024 1334   BILITOT 0.3 01/16/2024 1334       RADIOGRAPHIC STUDIES: I have personally reviewed the radiological images as listed and agreed with the findings in the report. No results found.  All questions were answered. The patient knows to call the clinic with any problems, questions or concerns. I spent 45 minutes in the care of this patient including H and P, review of records, counseling and coordination of care.     Amber Stalls, MD 05/20/2024 4:50 PM

## 2024-05-21 ENCOUNTER — Other Ambulatory Visit (HOSPITAL_COMMUNITY): Payer: Self-pay | Admitting: Hematology and Oncology

## 2024-06-18 ENCOUNTER — Ambulatory Visit: Payer: Self-pay | Admitting: Obstetrics & Gynecology

## 2024-06-25 ENCOUNTER — Telehealth (HOSPITAL_COMMUNITY): Payer: Self-pay

## 2024-06-25 NOTE — Telephone Encounter (Signed)
 Patient receiving [employer] Patient Assistance  ID: [id number]   Auth Submission: FREE DRUG Site of care: Site of care: [facility] Payer: None Medication & CPT/J Code(s) submitted: Venofer  (Iron  Sucrose) J1756 Diagnosis Code: D50.9 Free Drug (Manufacturer: [employer] Assist) Units/visits requested: 300mg  x 3 doses Approval from: 05/27/24 to 05/27/25

## 2024-07-17 ENCOUNTER — Ambulatory Visit (HOSPITAL_COMMUNITY)
Admission: RE | Admit: 2024-07-17 | Discharge: 2024-07-17 | Disposition: A | Discharge status: Home / Self Care | Payer: Self-pay | Source: Ambulatory Visit | Attending: Hematology and Oncology | Admitting: Hematology and Oncology

## 2024-07-17 VITALS — BP 109/69 | HR 81 | Temp 98.2°F | Resp 16

## 2024-07-17 DIAGNOSIS — D509 Iron deficiency anemia, unspecified: Secondary | ICD-10-CM | POA: Insufficient documentation

## 2024-07-17 MED ORDER — IRON SUCROSE 300 MG IVPB - SIMPLE MED
300.0000 mg | Freq: Once | Status: AC
  Administered 2024-07-17: 300 mg via INTRAVENOUS
  Filled 2024-07-17: qty 300

## 2024-07-20 ENCOUNTER — Encounter (HOSPITAL_COMMUNITY): Payer: Self-pay | Admitting: Hematology and Oncology

## 2024-07-30 ENCOUNTER — Ambulatory Visit (HOSPITAL_COMMUNITY)
Admission: RE | Admit: 2024-07-30 | Discharge: 2024-07-30 | Disposition: A | Payer: Self-pay | Source: Ambulatory Visit | Attending: Hematology and Oncology

## 2024-07-30 ENCOUNTER — Encounter (HOSPITAL_COMMUNITY): Payer: Self-pay | Admitting: Hematology and Oncology

## 2024-07-30 VITALS — BP 115/66 | HR 90 | Temp 97.7°F | Resp 16

## 2024-07-30 DIAGNOSIS — D509 Iron deficiency anemia, unspecified: Secondary | ICD-10-CM | POA: Insufficient documentation

## 2024-07-30 MED ORDER — IRON SUCROSE 300 MG IVPB - SIMPLE MED
300.0000 mg | Freq: Once | Status: AC
  Administered 2024-07-30: 300 mg via INTRAVENOUS
  Filled 2024-07-30: qty 300

## 2024-08-06 ENCOUNTER — Ambulatory Visit (HOSPITAL_COMMUNITY)
Admission: RE | Admit: 2024-08-06 | Discharge: 2024-08-06 | Disposition: A | Discharge status: Home / Self Care | Payer: Self-pay | Source: Ambulatory Visit | Attending: Hematology and Oncology | Admitting: Hematology and Oncology

## 2024-08-06 VITALS — BP 115/58 | HR 68 | Temp 97.4°F | Resp 16

## 2024-08-06 DIAGNOSIS — D509 Iron deficiency anemia, unspecified: Secondary | ICD-10-CM

## 2024-08-06 MED ORDER — IRON SUCROSE 300 MG IVPB - SIMPLE MED
300.0000 mg | Freq: Once | Status: AC
  Administered 2024-08-06: 300 mg via INTRAVENOUS
  Filled 2024-08-06: qty 300

## 2024-08-24 ENCOUNTER — Other Ambulatory Visit: Payer: Self-pay | Admitting: *Deleted

## 2024-08-24 DIAGNOSIS — D509 Iron deficiency anemia, unspecified: Secondary | ICD-10-CM

## 2024-08-25 ENCOUNTER — Inpatient Hospital Stay: Payer: Self-pay | Admitting: Hematology and Oncology

## 2024-08-25 ENCOUNTER — Inpatient Hospital Stay: Payer: Self-pay

## 2024-09-07 ENCOUNTER — Encounter: Payer: Self-pay | Admitting: Family Medicine

## 2024-09-07 NOTE — Telephone Encounter (Signed)
 Good morning val, are you able to help me with booking for Beverly Mckenzie

## 2024-09-10 ENCOUNTER — Ambulatory Visit: Payer: Self-pay | Admitting: Family Medicine

## 2024-09-10 ENCOUNTER — Encounter: Payer: Self-pay | Admitting: Family Medicine

## 2024-09-10 VITALS — BP 107/79 | HR 73 | Ht 67.0 in | Wt 193.6 lb

## 2024-09-10 DIAGNOSIS — N920 Excessive and frequent menstruation with regular cycle: Secondary | ICD-10-CM

## 2024-09-10 NOTE — Progress Notes (Unsigned)
.  irre

## 2024-09-10 NOTE — Progress Notes (Signed)
 "  Established Patient Office Visit  Subjective    Patient ID: Beverly Mckenzie, female    DOB: 01-24-2001  Age: 24 y.o. MRN: [id number]  CC:  Chief Complaint  Patient presents with   Menstrual Problem    HPI [name] presents for spotting. LMP 08/25/2024. Patient reports that she is not on birth control and she continues to have unprotected intercourse.   Outpatient Encounter Medications as of 09/10/2024  Medication Sig   acetaminophen  (TYLENOL ) 325 MG tablet Take 2 tablets (650 mg total) by mouth every 4 (four) hours as needed (for pain scale < 4). (Patient not taking: Reported on 09/10/2024)   ferrous sulfate  (FERROUSUL) 325 (65 FE) MG tablet Take 1 tablet (325 mg total) by mouth every other day. (Patient not taking: Reported on 09/10/2024)   ibuprofen  (ADVIL ) 600 MG tablet Take 1 tablet (600 mg total) by mouth every 6 (six) hours. (Patient not taking: Reported on 09/10/2024)   norethindrone -ethinyl estradiol  (LOESTRIN 1/20, 21,) 1-20 MG-MCG tablet Take 1 tablet by mouth daily. (Patient not taking: Reported on 09/10/2024)   No facility-administered encounter medications on file as of 09/10/2024.    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    Past Surgical History:  Procedure Laterality Date   NO PAST SURGERIES      Family History  Problem Relation Age of Onset   Healthy Mother    Healthy Father     Social History   Socioeconomic History   Marital status: Single    Spouse name: Not on file   Number of children: Not on file   Years of education: Not on file   Highest education level: Associate degree: academic program  Occupational History   Not on file  Tobacco Use   Smoking status: Never   Smokeless tobacco: Never  Vaping Use   Vaping status: Never Used  Substance and Sexual Activity   Alcohol use: No   Drug use: No   Sexual activity: Not Currently    Birth  control/protection: None  Other Topics Concern   Not on file  Social History Narrative   Not on file   Social Drivers of Health   Tobacco Use: Low Risk (09/11/2024)   Patient History    Smoking Tobacco Use: Never    Smokeless Tobacco Use: Never    Passive Exposure: Not on file  Financial Resource Strain: Low Risk (09/09/2024)   Overall Financial Resource Strain (CARDIA)    Difficulty of Paying Living Expenses: Not very hard  Food Insecurity: No Food Insecurity (09/09/2024)   Epic    Worried About Programme Researcher, Broadcasting/film/video in the Last Year: Never true    Ran Out of Food in the Last Year: Never true  Transportation Needs: No Transportation Needs (09/09/2024)   Epic    Lack of Transportation (Medical): No    Lack of Transportation (Non-Medical): No  Physical Activity: Insufficiently Active (09/09/2024)   Exercise Vital Sign    Days of Exercise per Week: 2 days    Minutes of Exercise per Session: 20 min  Stress: No Stress Concern Present (09/09/2024)   [employer] of Occupational Health - Occupational Stress Questionnaire    Feeling of Stress: Not at all  Social Connections: Socially Integrated (09/09/2024)   Social Connection and Isolation Panel    Frequency of Communication with Friends and Family: More than  three times a week    Frequency of Social Gatherings with Friends and Family: Twice a week    Attends Religious Services: More than 4 times per year    Active Member of Clubs or Organizations: Yes    Attends Banker Meetings: More than 4 times per year    Marital Status: Married  Catering Manager Violence: Not At Risk (05/22/2024)   Epic    Fear of Current or Ex-Partner: No    Emotionally Abused: No    Physically Abused: No    Sexually Abused: No  Depression (PHQ2-9): Low Risk (05/22/2024)   Depression (PHQ2-9)    PHQ-2 Score: 0  Alcohol Screen: Low Risk (09/10/2024)   Alcohol Screen    Last Alcohol Screening Score (AUDIT): 0  Housing: High Risk (09/09/2024)    Epic    Unable to Pay for Housing in the Last Year: No    Number of Times Moved in the Last Year: 3    Homeless in the Last Year: No  Utilities: Not At Risk (05/22/2024)   Epic    Threatened with loss of utilities: No  Health Literacy: Adequate Health Literacy (09/10/2024)   B1300 Health Literacy    Frequency of need for help with medical instructions: Never    Review of Systems  All other systems reviewed and are negative.       Objective    BP 107/79   Pulse 73   Ht 5' 7 (1.702 m)   Wt 193 lb 9.6 oz (87.8 kg)   LMP 08/29/2024   SpO2 98%   BMI 30.32 kg/m   Physical Exam Vitals and nursing note reviewed.  Constitutional:      General: She is not in acute distress. Cardiovascular:     Rate and Rhythm: Normal rate and regular rhythm.  Pulmonary:     Effort: Pulmonary effort is normal.     Breath sounds: Normal breath sounds.  Abdominal:     Palpations: Abdomen is soft.     Tenderness: There is no abdominal tenderness.  Neurological:     General: No focal deficit present.     Mental Status: She is alert and oriented to person, place, and time.         Assessment & Plan:   Intermenstrual spotting -     hCG, quantitative, pregnancy -     Beta hCG quant (ref lab)     Return if symptoms worsen or fail to improve.   Tanda Raguel SQUIBB, MD   "

## 2024-09-11 ENCOUNTER — Encounter: Payer: Self-pay | Admitting: Family Medicine

## 2024-09-11 NOTE — Telephone Encounter (Signed)
 Spoke to [facility] and confirmed they received the sample and there was no issues with it. Was told results should be ready around 9 pm tonight 09/11/24

## 2024-09-11 NOTE — Telephone Encounter (Signed)
 Copied from CRM [id number]. Topic: Clinical - Lab/Test Results >> Sep 11, 2024  2:37 PM Terri G wrote: Reason for CRM: Patient is calling regarding her results. She wanted to know if she needed to do it again since they didn't collect a lot of blood or what's going on. Advised to allow more time and once results are ready they will call her to let her know the next steps

## 2024-09-12 LAB — BETA HCG QUANT (REF LAB): hCG Quant: <1 m[IU]/mL

## 2024-09-15 ENCOUNTER — Ambulatory Visit: Payer: Self-pay | Admitting: Family Medicine

## 2024-09-15 NOTE — Progress Notes (Signed)
 Patient reviewed lab results and provider recommendations via MyChart
